# Patient Record
Sex: Female | Born: 1985 | Race: White | Hispanic: No | Marital: Married | Smoking: Former smoker
Health system: Southern US, Community
[De-identification: ages and names within clinical notes are randomized; demographics above are authoritative.]

## PROBLEM LIST (undated history)

## (undated) ENCOUNTER — Inpatient Hospital Stay (HOSPITAL_COMMUNITY): Payer: Self-pay

## (undated) DIAGNOSIS — N12 Tubulo-interstitial nephritis, not specified as acute or chronic: Secondary | ICD-10-CM

## (undated) DIAGNOSIS — R87629 Unspecified abnormal cytological findings in specimens from vagina: Secondary | ICD-10-CM

## (undated) DIAGNOSIS — O2302 Infections of kidney in pregnancy, second trimester: Secondary | ICD-10-CM

## (undated) HISTORY — PX: ADENOIDECTOMY: SUR15

---

## 2007-02-03 ENCOUNTER — Inpatient Hospital Stay (HOSPITAL_COMMUNITY): Admission: AD | Admit: 2007-02-03 | Discharge: 2007-02-03 | Payer: Self-pay | Admitting: Obstetrics and Gynecology

## 2007-02-07 ENCOUNTER — Inpatient Hospital Stay (HOSPITAL_COMMUNITY): Admission: AD | Admit: 2007-02-07 | Discharge: 2007-02-09 | Payer: Self-pay | Admitting: Obstetrics and Gynecology

## 2011-04-18 LAB — CBC
HCT: 30 — ABNORMAL LOW
HCT: 34 — ABNORMAL LOW
Hemoglobin: 10.1 — ABNORMAL LOW
MCHC: 33
MCHC: 33.3
MCHC: 33.7
MCV: 81.5
Platelets: 239
Platelets: 283
RBC: 3.94
RDW: 13.7
RDW: 14.1 — ABNORMAL HIGH
WBC: 14.9 — ABNORMAL HIGH

## 2015-07-05 NOTE — L&D Delivery Note (Signed)
Delivery Note  At 2052 a viable female was delivered SVD via  (Presentation:LOA ;  ) over intact perineum. APGAR: 9/9, ; weight  .  pending Placenta status: Spontaneous Intact , Pitocin infusing .  Cord:  3 vesselswith the following complications: triple nuchal cord     Anesthesia:  Epidural Episiotomy:   Lacerations:  first degree vaginal lac Suture Repair: 2.0 vicryl Est. Blood Loss (mL):  100  Mom to postpartum.  Baby to Couplet care / Skin to Skin.  Name : Jillian Mcfarland 03/31/2016, 9:12 PM

## 2015-09-14 LAB — OB RESULTS CONSOLE HIV ANTIBODY (ROUTINE TESTING): HIV: NONREACTIVE

## 2015-09-14 LAB — OB RESULTS CONSOLE HEPATITIS B SURFACE ANTIGEN: HEP B S AG: NEGATIVE

## 2015-09-14 LAB — OB RESULTS CONSOLE RUBELLA ANTIBODY, IGM: Rubella: NON-IMMUNE/NOT IMMUNE

## 2015-09-14 LAB — OB RESULTS CONSOLE RPR: RPR: NONREACTIVE

## 2015-09-14 LAB — OB RESULTS CONSOLE GC/CHLAMYDIA
CHLAMYDIA, DNA PROBE: NEGATIVE
GC PROBE AMP, GENITAL: NEGATIVE

## 2016-01-07 ENCOUNTER — Inpatient Hospital Stay (HOSPITAL_COMMUNITY): Payer: Medicaid Other

## 2016-01-07 ENCOUNTER — Inpatient Hospital Stay (HOSPITAL_COMMUNITY)
Admission: AD | Admit: 2016-01-07 | Discharge: 2016-01-09 | DRG: 781 | Disposition: A | Discharge status: Home / Self Care | Payer: Medicaid Other | Source: Ambulatory Visit | Attending: Obstetrics and Gynecology | Admitting: Obstetrics and Gynecology

## 2016-01-07 ENCOUNTER — Encounter (HOSPITAL_COMMUNITY): Payer: Self-pay | Admitting: *Deleted

## 2016-01-07 DIAGNOSIS — Z3A27 27 weeks gestation of pregnancy: Secondary | ICD-10-CM

## 2016-01-07 DIAGNOSIS — G43909 Migraine, unspecified, not intractable, without status migrainosus: Secondary | ICD-10-CM | POA: Diagnosis not present

## 2016-01-07 DIAGNOSIS — Z87891 Personal history of nicotine dependence: Secondary | ICD-10-CM

## 2016-01-07 DIAGNOSIS — O2302 Infections of kidney in pregnancy, second trimester: Principal | ICD-10-CM | POA: Diagnosis present

## 2016-01-07 DIAGNOSIS — G43809 Other migraine, not intractable, without status migrainosus: Secondary | ICD-10-CM | POA: Diagnosis present

## 2016-01-07 DIAGNOSIS — B9620 Unspecified Escherichia coli [E. coli] as the cause of diseases classified elsewhere: Secondary | ICD-10-CM | POA: Diagnosis present

## 2016-01-07 DIAGNOSIS — Z789 Other specified health status: Secondary | ICD-10-CM

## 2016-01-07 DIAGNOSIS — Z283 Underimmunization status: Secondary | ICD-10-CM

## 2016-01-07 DIAGNOSIS — O09899 Supervision of other high risk pregnancies, unspecified trimester: Secondary | ICD-10-CM

## 2016-01-07 DIAGNOSIS — O9989 Other specified diseases and conditions complicating pregnancy, childbirth and the puerperium: Secondary | ICD-10-CM

## 2016-01-07 DIAGNOSIS — N1330 Unspecified hydronephrosis: Secondary | ICD-10-CM | POA: Diagnosis present

## 2016-01-07 DIAGNOSIS — O2303 Infections of kidney in pregnancy, third trimester: Secondary | ICD-10-CM | POA: Diagnosis present

## 2016-01-07 HISTORY — DX: Unspecified abnormal cytological findings in specimens from vagina: R87.629

## 2016-01-07 HISTORY — DX: Infections of kidney in pregnancy, second trimester: O23.02

## 2016-01-07 LAB — TYPE AND SCREEN
ABO/RH(D): O POS
Antibody Screen: NEGATIVE

## 2016-01-07 LAB — COMPREHENSIVE METABOLIC PANEL
ALT: 15 U/L (ref 14–54)
ANION GAP: 10 (ref 5–15)
AST: 19 U/L (ref 15–41)
Albumin: 2.8 g/dL — ABNORMAL LOW (ref 3.5–5.0)
Alkaline Phosphatase: 140 U/L — ABNORMAL HIGH (ref 38–126)
BUN: 8 mg/dL (ref 6–20)
CALCIUM: 8.6 mg/dL — AB (ref 8.9–10.3)
CHLORIDE: 104 mmol/L (ref 101–111)
CO2: 18 mmol/L — AB (ref 22–32)
Creatinine, Ser: 0.56 mg/dL (ref 0.44–1.00)
GFR calc non Af Amer: >60 mL/min (ref >60)
Glucose, Bld: 71 mg/dL (ref 65–99)
POTASSIUM: 3.8 mmol/L (ref 3.5–5.1)
SODIUM: 132 mmol/L — AB (ref 135–145)
TOTAL PROTEIN: 6.5 g/dL (ref 6.5–8.1)
Total Bilirubin: 0.9 mg/dL (ref 0.3–1.2)

## 2016-01-07 LAB — URINE MICROSCOPIC-ADD ON

## 2016-01-07 LAB — URINALYSIS, ROUTINE W REFLEX MICROSCOPIC
BILIRUBIN URINE: NEGATIVE
Glucose, UA: NEGATIVE mg/dL
NITRITE: NEGATIVE
Protein, ur: NEGATIVE mg/dL
SPECIFIC GRAVITY, URINE: 1.01 (ref 1.005–1.030)
pH: 5.5 (ref 5.0–8.0)

## 2016-01-07 LAB — CBC WITH DIFFERENTIAL/PLATELET
Basophils Absolute: 0 10*3/uL (ref 0.0–0.1)
Basophils Relative: 0 %
EOS ABS: 0 10*3/uL (ref 0.0–0.7)
EOS PCT: 0 %
HCT: 31.4 % — ABNORMAL LOW (ref 36.0–46.0)
Hemoglobin: 10.6 g/dL — ABNORMAL LOW (ref 12.0–15.0)
LYMPHS ABS: 1.9 10*3/uL (ref 0.7–4.0)
Lymphocytes Relative: 15 %
MCH: 27.5 pg (ref 26.0–34.0)
MCHC: 33.8 g/dL (ref 30.0–36.0)
MCV: 81.6 fL (ref 78.0–100.0)
MONOS PCT: 6 %
Monocytes Absolute: 0.7 10*3/uL (ref 0.1–1.0)
Neutro Abs: 10 10*3/uL — ABNORMAL HIGH (ref 1.7–7.7)
Neutrophils Relative %: 79 %
PLATELETS: 299 10*3/uL (ref 150–400)
RBC: 3.85 MIL/uL — ABNORMAL LOW (ref 3.87–5.11)
RDW: 13.2 % (ref 11.5–15.5)
WBC: 12.7 10*3/uL — ABNORMAL HIGH (ref 4.0–10.5)

## 2016-01-07 MED ORDER — PRENATAL MULTIVITAMIN CH
1.0000 | ORAL_TABLET | Freq: Every day | ORAL | Status: DC
  Administered 2016-01-08: 1 via ORAL
  Filled 2016-01-07: qty 1

## 2016-01-07 MED ORDER — DEXTROSE 5 % IN LACTATED RINGERS IV BOLUS
1000.0000 mL | Freq: Once | INTRAVENOUS | Status: AC
  Administered 2016-01-07: 1000 mL via INTRAVENOUS

## 2016-01-07 MED ORDER — DOCUSATE SODIUM 100 MG PO CAPS
100.0000 mg | ORAL_CAPSULE | Freq: Every day | ORAL | Status: DC
Start: 2016-01-07 — End: 2016-01-09
  Administered 2016-01-08 – 2016-01-09 (×2): 100 mg via ORAL
  Filled 2016-01-07 (×2): qty 1

## 2016-01-07 MED ORDER — LACTATED RINGERS IV BOLUS (SEPSIS)
1000.0000 mL | Freq: Once | INTRAVENOUS | Status: AC
  Administered 2016-01-07: 1000 mL via INTRAVENOUS

## 2016-01-07 MED ORDER — CEFTRIAXONE SODIUM 2 G IJ SOLR
2.0000 g | INTRAMUSCULAR | Status: DC
  Administered 2016-01-07 – 2016-01-08 (×2): 2 g via INTRAVENOUS
  Filled 2016-01-07 (×3): qty 2

## 2016-01-07 MED ORDER — ZOLPIDEM TARTRATE 5 MG PO TABS: 5.0000 mg | ORAL_TABLET | Freq: Every evening | ORAL | Status: DC | PRN

## 2016-01-07 MED ORDER — SODIUM CHLORIDE 0.9 % IV SOLN
INTRAVENOUS | Status: DC
  Administered 2016-01-07 – 2016-01-08 (×3): via INTRAVENOUS
  Administered 2016-01-09: 150 mL/h via INTRAVENOUS
  Administered 2016-01-09: 07:00:00 via INTRAVENOUS

## 2016-01-07 MED ORDER — ACETAMINOPHEN 325 MG PO TABS
650.0000 mg | ORAL_TABLET | ORAL | Status: DC | PRN
  Administered 2016-01-08 (×3): 650 mg via ORAL
  Filled 2016-01-07 (×3): qty 2

## 2016-01-07 MED ORDER — CALCIUM CARBONATE ANTACID 500 MG PO CHEW
2.0000 | CHEWABLE_TABLET | ORAL | Status: DC | PRN
  Administered 2016-01-08: 400 mg via ORAL
  Filled 2016-01-07: qty 2

## 2016-01-07 NOTE — MAU Provider Note (Signed)
History     CSN: [id number]  Arrival date and time: 01/07/16 1708   First Provider Initiated Contact with Patient 01/07/16 1802      Chief Complaint  Patient presents with  . Dehydration   HPI   [name] is a 30 y.o. female G2P1001 @ 6w1dhere with multiple complaints " I haven't felt well all day". The symptoms started over the weekend " I felt like I had a UTI over the weekend because my bladder hurt". Her symptoms currently include HA, increased thirst, minimal energy, mild temp at home, right sided lower back pain and nausea and vomiting.   She has had no vomiting today, however feels nauseated. She has not been able to eat at all today. She rates her back pain 3/10; right side and constant pain. The pain is lower in her back, not high. The pain feels deep, "if I push on it I cannot feel the pain".  No one else in her house is sick.   OB History    Gravida Para Term Preterm AB TAB SAB Ectopic Multiple Living   '2 1 1       1      '$ Past Medical History  Diagnosis Date  . Vaginal Pap smear, abnormal     Past Surgical History  Procedure Laterality Date  . Adenoidectomy      History reviewed. No pertinent family history.  Social History  Substance Use Topics  . Smoking status: Former Smoker -- 1.00 packs/day for 10 years    Types: Cigarettes    Quit date: 01/06/2014  . Smokeless tobacco: None  . Alcohol Use: No    Allergies: Allergies not on file  No prescriptions prior to admission   Results for orders placed or performed during the hospital encounter of 01/07/16 (from the past 48 hour(s))  [facility]inal[facility], [facility] reflex microscopic [facility]     Status: Abnormal   Collection Time: 01/07/16  5:15 PM  Result Value Ref Range   Color, Urine YELLOW YELLOW   APPearance CLEAR CLEAR   Specific Gravity, Urine 1.010 1.005 - 1.030   pH 5.5 5.0 - 8.0   Glucose, UA NEGATIVE NEGATIVE mg/dL   Hgb urine dipstick TRACE (A) NEGATIVE   Bilirubin Urine NEGATIVE  NEGATIVE   Ketones, ur >80 (A) NEGATIVE mg/dL   Protein, ur NEGATIVE NEGATIVE mg/dL   Nitrite NEGATIVE NEGATIVE   Leukocytes, UA SMALL (A) NEGATIVE  Urine microscopic-add on     Status: Abnormal   Collection Time: 01/07/16  5:15 PM  Result Value Ref Range   Squamous Epithelial / LPF 0-5 (A) NONE SEEN   WBC, UA 6-30 0 - 5 WBC/hpf   RBC / HPF 0-5 0 - 5 RBC/hpf   Bacteria, UA FEW (A) NONE SEEN  CBC with Differential     Status: Abnormal   Collection Time: 01/07/16  6:36 PM  Result Value Ref Range   WBC 12.7 (H) 4.0 - 10.5 K/uL   RBC 3.85 (L) 3.87 - 5.11 MIL/uL   Hemoglobin 10.6 (L) 12.0 - 15.0 g/dL   HCT 31.4 (L) 36.0 - 46.0 %   MCV 81.6 78.0 - 100.0 fL   MCH 27.5 26.0 - 34.0 pg   MCHC 33.8 30.0 - 36.0 g/dL   RDW 13.2 11.5 - 15.5 %   Platelets 299 150 - 400 K/uL   Neutrophils Relative % 79 %   Neutro Abs 10.0 (H) 1.7 - 7.7 K/uL   Lymphocytes Relative 15 %  Lymphs Abs 1.9 0.7 - 4.0 K/uL   Monocytes Relative 6 %   Monocytes Absolute 0.7 0.1 - 1.0 K/uL   Eosinophils Relative 0 %   Eosinophils Absolute 0.0 0.0 - 0.7 K/uL   Basophils Relative 0 %   Basophils Absolute 0.0 0.0 - 0.1 K/uL  Comprehensive metabolic panel     Status: Abnormal   Collection Time: 01/07/16  6:36 PM  Result Value Ref Range   Sodium 132 (L) 135 - 145 mmol/L   Potassium 3.8 3.5 - 5.1 mmol/L   Chloride 104 101 - 111 mmol/L   CO2 18 (L) 22 - 32 mmol/L   Glucose, Bld 71 65 - 99 mg/dL   BUN 8 6 - 20 mg/dL   Creatinine, Ser 0.56 0.44 - 1.00 mg/dL   Calcium 8.6 (L) 8.9 - 10.3 mg/dL   Total Protein 6.5 6.5 - 8.1 g/dL   Albumin 2.8 (L) 3.5 - 5.0 g/dL   AST 19 15 - 41 U/L   ALT 15 14 - 54 U/L   Alkaline Phosphatase 140 (H) 38 - 126 U/L   Total Bilirubin 0.9 0.3 - 1.2 mg/dL   GFR calc non Af Amer >60 >60 mL/min   GFR calc Af Amer >60 >60 mL/min    Comment: (NOTE) The eGFR has been calculated using the CKD EPI equation. This calculation has not been validated in all clinical situations. eGFR's persistently  <60 mL/min signify possible Chronic Kidney Disease.    Anion gap 10 5 - 15    Review of Systems  Constitutional: Positive for fever and chills.  Gastrointestinal: Negative for nausea, vomiting and abdominal pain.  Genitourinary: Positive for dysuria, urgency and frequency. Negative for hematuria and flank pain.  Musculoskeletal: Positive for back pain.   Physical Exam   Blood pressure 130/72, pulse 113, temperature 100 F (37.8 C), temperature source Oral, resp. rate 18, height '5\' 6"'$  (1.676 m), weight 209 lb 6.4 oz (94.983 kg), SpO2 100 %.  Physical Exam  Constitutional: She is oriented to person, place, and time. She appears well-developed and well-nourished.  Non-toxic appearance. She has a sickly appearance. No distress.  GI: There is no CVA tenderness.  Musculoskeletal: Normal range of motion.  Neurological: She is alert and oriented to person, place, and time.  Skin: Skin is warm. She is not diaphoretic.  Psychiatric: Her behavior is normal.   Fetal Tracing: Baseline: 150 bpm  Variability: Moderate  Accelerations: 15x15 Decelerations: none Toco: none  MAU Course  Procedures  None  MDM  Patient declines the need for pain medication at this time. No vomiting noted in MAU.  LR bolus X 1 D5 LR bolus X 1 CBC CMP  Renal US Discussed patient with Dr. Cletis Media, discussed HPI, labs and plan of care.  Report given to V. Cira Servant CNM who resumes care of the patient. Patient awaiting renal US.   Lezlie Lye, NP 01/07/2016 7:50 PM   Assessment and Plan

## 2016-01-07 NOTE — MAU Note (Signed)
Pt reports she had been having some pain with urination and her urine was dark since Monday. Also c/o of some back and flank pain. Felt like she was running a fever last night and today. Went to MD office and they told her she was dehyrtated and sent her to MAU.

## 2016-01-07 NOTE — H&P (Signed)
Jillian Mcfarland is a 30 y.o. female, G2P1001 at 3127 1/7 weeks, presenting for admission for presumptive pyelonephritis.  Seen in office today with c/o fever, N/V, malaise since 2 days ago.  Reported low-grade fever of 99, with vomiting last night.  Sent to MAU for IV hydration and further evaluation.  T max 100 at 1719.  Denies leaking, bleeding, reports +FM.    Feeling better after IV hydration, but still has low-grade fever and right CVAT.  Renal US showed right hydronephrosis, UA with small LE and trace blood.  Patient Active Problem List   Diagnosis Date Noted  . Pyelonephritis affecting pregnancy 01/07/2016  . Migraine syndrome 01/07/2016  . Rubella non-immune status, antepartum 01/07/2016    History of present pregnancy: Patient entered care at 10 5/7 weeks.   EDC of 03/30/16 was established by LMP, and congruent with US in 1st trimester.   Anatomy scan: 20 6/7 weeks, with limited anatomy and an anterior placenta.   Additional US evaluations:   23 6/7 weeks, for f/u anatomy:  EFW 1+8, 68%ile, normal fluid, normal cervical length Significant prenatal events:  On ATB (Macrobid) early pregnancy for positive urine culture with 100K Ecoli.  Repeat urine culture 12/15/15 showed 30K Ecoli, but no sx.  Was not treated at that time due to no sx. Last evaluation:  01/07/16 at office for problem visit of UTI sx, malaise, abdominal pain, fever.  BP 118/62, weight 209.  OB History    Gravida Para Term Preterm AB TAB SAB Ectopic Multiple Living   2 1 1       1     2008--[facility], 41 weeks, 8 hour labor, 7+3[facility]  Past Medical History  Diagnosis Date  . Vaginal Pap smear, abnormal    Past Surgical History  Procedure Laterality Date  . Adenoidectomy     Family History: MGM asthma, cancer,arthritis; MGM cancer, arthritis; Brother ankylosing spondylitis; PGM and PGF arthritis Social History:  reports that she quit smoking about 2 years ago. Her smoking use included Cigarettes. She has a 10  pack-year smoking history. She does not have any smokeless tobacco history on file. She reports that she does not drink alcohol or use illicit drugs.  Patient is Caucasian, of the [place], has some college, employed as Chartered loss adjusterchildcare provider. Married to TX[employer] Corpafael Tomson, who is present and supportive.   Prenatal Transfer Tool  Maternal Diabetes:  Not tested yet Genetic Screening: Normal first trimester screen and AFP Maternal Ultrasounds/Referrals: Normal Fetal Ultrasounds or other Referrals:  None Maternal Substance Abuse:  No Significant Maternal Medications:  None Significant Maternal Lab Results: None  TDAP NA Flu NA  ROS:  Right flank pain, fever, nausea, malaise  No Known Allergies    Blood pressure 130/72, pulse 110, temperature 98.7 F (37.1 C), temperature source Oral, resp. rate 18, height 5\' 6"  (1.676 m), weight 94.983 kg (209 lb 6.4 oz), SpO2 96 %.   Filed Vitals:   01/07/16 2101 01/07/16 2109 01/07/16 2114 01/07/16 2119  BP:      Pulse: 111 119 103 110  Temp:    98.7 F (37.1 C)  TempSrc:    Oral  Resp:      Height:      Weight:      SpO2: 100% 100% 99% 96%  Temp max 100 at 1719, most recent 99.3 at 9:45p  Physical Exam: Does not appear toxic, but face is flushed Chest clear Heart RRR without murmur, mild tachycardia Abd gravid, NT, FH 27  cm + CVAT on right side Pelvic: deferred Ext: WNL, no edema  FHR: Category 1 UCs:  None  Results for orders placed or performed during the hospital encounter of 01/07/16 (from the past 24 hour(s))  Urinalysis, Routine w reflex microscopic (not at [facility])     Status: Abnormal   Collection Time: 01/07/16  5:15 PM  Result Value Ref Range   Color, Urine YELLOW YELLOW   APPearance CLEAR CLEAR   Specific Gravity, Urine 1.010 1.005 - 1.030   pH 5.5 5.0 - 8.0   Glucose, UA NEGATIVE NEGATIVE mg/dL   Hgb urine dipstick TRACE (A) NEGATIVE   Bilirubin Urine NEGATIVE NEGATIVE   Ketones, ur >80 (A) NEGATIVE mg/dL   Protein,  ur NEGATIVE NEGATIVE mg/dL   Nitrite NEGATIVE NEGATIVE   Leukocytes, UA SMALL (A) NEGATIVE  Urine microscopic-add on     Status: Abnormal   Collection Time: 01/07/16  5:15 PM  Result Value Ref Range   Squamous Epithelial / LPF 0-5 (A) NONE SEEN   WBC, UA 6-30 0 - 5 WBC/hpf   RBC / HPF 0-5 0 - 5 RBC/hpf   Bacteria, UA FEW (A) NONE SEEN  CBC with Differential     Status: Abnormal   Collection Time: 01/07/16  6:36 PM  Result Value Ref Range   WBC 12.7 (H) 4.0 - 10.5 K/uL   RBC 3.85 (L) 3.87 - 5.11 MIL/uL   Hemoglobin 10.6 (L) 12.0 - 15.0 g/dL   HCT 16.1 (L) 09.6 - 04.5 %   MCV 81.6 78.0 - 100.0 fL   MCH 27.5 26.0 - 34.0 pg   MCHC 33.8 30.0 - 36.0 g/dL   RDW 40.9 81.1 - 91.4 %   Platelets 299 150 - 400 K/uL   Neutrophils Relative % 79 %   Neutro Abs 10.0 (H) 1.7 - 7.7 K/uL   Lymphocytes Relative 15 %   Lymphs Abs 1.9 0.7 - 4.0 K/uL   Monocytes Relative 6 %   Monocytes Absolute 0.7 0.1 - 1.0 K/uL   Eosinophils Relative 0 %   Eosinophils Absolute 0.0 0.0 - 0.7 K/uL   Basophils Relative 0 %   Basophils Absolute 0.0 0.0 - 0.1 K/uL  Comprehensive metabolic panel     Status: Abnormal   Collection Time: 01/07/16  6:36 PM  Result Value Ref Range   Sodium 132 (L) 135 - 145 mmol/L   Potassium 3.8 3.5 - 5.1 mmol/L   Chloride 104 101 - 111 mmol/L   CO2 18 (L) 22 - 32 mmol/L   Glucose, Bld 71 65 - 99 mg/dL   BUN 8 6 - 20 mg/dL   Creatinine, Ser 7.82 0.44 - 1.00 mg/dL   Calcium 8.6 (L) 8.9 - 10.3 mg/dL   Total Protein 6.5 6.5 - 8.1 g/dL   Albumin 2.8 (L) 3.5 - 5.0 g/dL   AST 19 15 - 41 U/L   ALT 15 14 - 54 U/L   Alkaline Phosphatase 140 (H) 38 - 126 U/L   Total Bilirubin 0.9 0.3 - 1.2 mg/dL   GFR calc non Af Amer >60 >60 mL/min   GFR calc Af Amer >60 >60 mL/min   Anion gap 10 5 - 15   [place]:   IMPRESSION: Moderate RIGHT hydronephrosis with renal pelvis 2.3 cm diameter.  Diminished RIGHT ureteral jet versus LEFT.  Distal RIGHT ureteral obstruction not excluded.  Prenatal  labs: ABO, Rh:  O+ Antibody:  Neg Rubella:  Non-immune RPR:   NR HBsAg:   Neg  HIV:   NR GBS:  NA Sickle cell/Hgb electrophoresis:  NA Pap:  WNL 2016 GC:  Negative 09/2015 Chlamydia:  Negative 09/2015 Genetic screenings:  Normal 1st trimester screen and AFP Glucola:  NA Other:   Hgb 12.9 at NOB   Assessment/Plan: IUP at 27 1/7 weeks Presumptive pyelonephritis  Right hydronephrosis  Plan: Admit to Antenatal per consult with Dr. Dion BodyVarnado Rocephin 2 gm IV q 24 hours IV hydration Zofran prn nausea Percocet prn pain Repeat CBC/diff in am Tylenol prn fever >/=100.5 Await urine culture  Liseth Wann, CNM, [place] 01/07/2016, 10:20 PM

## 2016-01-08 DIAGNOSIS — E860 Dehydration: Secondary | ICD-10-CM | POA: Diagnosis present

## 2016-01-08 DIAGNOSIS — Z87891 Personal history of nicotine dependence: Secondary | ICD-10-CM | POA: Diagnosis not present

## 2016-01-08 DIAGNOSIS — Z3A27 27 weeks gestation of pregnancy: Secondary | ICD-10-CM | POA: Diagnosis not present

## 2016-01-08 DIAGNOSIS — G43809 Other migraine, not intractable, without status migrainosus: Secondary | ICD-10-CM | POA: Diagnosis present

## 2016-01-08 DIAGNOSIS — N1330 Unspecified hydronephrosis: Secondary | ICD-10-CM | POA: Diagnosis present

## 2016-01-08 DIAGNOSIS — O2302 Infections of kidney in pregnancy, second trimester: Secondary | ICD-10-CM | POA: Diagnosis present

## 2016-01-08 DIAGNOSIS — Z789 Other specified health status: Secondary | ICD-10-CM | POA: Diagnosis not present

## 2016-01-08 DIAGNOSIS — B9620 Unspecified Escherichia coli [E. coli] as the cause of diseases classified elsewhere: Secondary | ICD-10-CM | POA: Diagnosis present

## 2016-01-08 LAB — CBC WITH DIFFERENTIAL/PLATELET
BASOS ABS: 0 10*3/uL (ref 0.0–0.1)
Basophils Relative: 0 %
EOS ABS: 0 10*3/uL (ref 0.0–0.7)
EOS PCT: 0 %
HCT: 29.7 % — ABNORMAL LOW (ref 36.0–46.0)
Hemoglobin: 9.9 g/dL — ABNORMAL LOW (ref 12.0–15.0)
LYMPHS PCT: 16 %
Lymphs Abs: 1.8 10*3/uL (ref 0.7–4.0)
MCH: 27 pg (ref 26.0–34.0)
MCHC: 33.3 g/dL (ref 30.0–36.0)
MCV: 81.1 fL (ref 78.0–100.0)
Monocytes Absolute: 0.7 10*3/uL (ref 0.1–1.0)
Monocytes Relative: 7 %
Neutro Abs: 8.6 10*3/uL — ABNORMAL HIGH (ref 1.7–7.7)
Neutrophils Relative %: 77 %
PLATELETS: 283 10*3/uL (ref 150–400)
RBC: 3.66 MIL/uL — AB (ref 3.87–5.11)
RDW: 13.3 % (ref 11.5–15.5)
WBC: 11.1 10*3/uL — AB (ref 4.0–10.5)

## 2016-01-08 LAB — ABO/RH: ABO/RH(D): O POS

## 2016-01-08 NOTE — Progress Notes (Signed)
  [place], [facility] doctorAmber  Subjective:  Patient reports feeling better, but still continues with right side mid back pain, 3/10 intensity, constant, but worsens when she urinates.  She denies contractions, vaginal bleeding or leakage of fluid.  She reports normal fetal movement.  Objective: I have reviewed patient's vital signs, medications and labs.  Filed Vitals:   01/08/16 0514 01/08/16 1029  BP: 114/61 108/57  Pulse: 98 97  Temp: 98.2 F (36.8 C) 98.2 F (36.8 C)  Resp: 20 18   Tmax 100.0 on 01/08/16@ 1719.      General: alert, cooperative and no distress Resp: clear to auscultation bilaterally Cardio: regular rate and rhythm, S1, S2 normal, no murmur, click, rub or gallop GI: soft, non-tender; bowel sounds normal; no masses,  no organomegaly Extremities: extremities normal, atraumatic, no cyanosis or edema  Back: no CVA tenderness bilaterally.  No skin changes bilaterally.    CBC    Component Value Date/Time   WBC 11.1* 01/08/2016 0553   RBC 3.66* 01/08/2016 0553   HGB 9.9* 01/08/2016 0553   HCT 29.7* 01/08/2016 0553   PLT 283 01/08/2016 0553   MCV 81.1 01/08/2016 0553   MCH 27.0 01/08/2016 0553   MCHC 33.3 01/08/2016 0553   RDW 13.3 01/08/2016 0553   LYMPHSABS 1.8 01/08/2016 0553   MONOABS 0.7 01/08/2016 0553   EOSABS 0.0 01/08/2016 0553   BASOSABS 0.0 01/08/2016 0553   . cefTRIAXone (ROCEPHIN)  IV  2 g Intravenous Q24H  . docusate sodium  100 mg Oral Daily  . prenatal multivitamin  1 tablet Oral Q1200     Assessment/Plan: 30 y/o G2P1001 @ 2927 W 3 days EGA admitted with rnausea, vomiting, malaise, right sided back pain thought to be secondary to pyelonephritis, stable  -Urine culture pending, f/u. -Antibiotics- Rocephin -Out of bed and ambulation  -Pain meds prn -Plan for discharge tomorrow if clinically improved.      [facility] Center[facility]UL[facility],Sitlaly Gudiel WAKURU 01/08/2016, 1:11 PM

## 2016-01-09 LAB — CULTURE, OB URINE
Culture: >=100000 — AB
Special Requests: NORMAL

## 2016-01-09 MED ORDER — SULFAMETHOXAZOLE-TRIMETHOPRIM 800-160 MG PO TABS
1.0000 | ORAL_TABLET | Freq: Two times a day (BID) | ORAL | Status: AC
Start: 2016-01-09 — End: 2016-01-23

## 2016-01-09 NOTE — Discharge Summary (Signed)
OB Discharge Summary     Patient Name: Jillian Mcfarland DOB: 1986/06/15 MRN: [id number]  Date of admission: 01/07/2016 Delivering MD: This patient has no babies on file.  Date of discharge: 01/09/2016  Admitting diagnosis: 27WKS DEHYDRATION Intrauterine pregnancy: [redacted]w[redacted]d     Secondary diagnosis:  Active Problems:   Pyelonephritis affecting pregnancy   Migraine syndrome   Rubella non-immune status, antepartum  Additional problems: None     Discharge diagnosis: Pyelonephritis in pregnancy                                                                                         Hospital course:  Patient presented with nausea vomiting malaise and right-sided back pain. She received IV antibiotics for presumed pyelonephritis. Urine culture came back with greater than 100,000 Escherichia coli. She improved clinically, remained afebrile and was deemed stable for discharge after 2 days.  Physical exam  Filed Vitals:   01/08/16 1851 01/08/16 2110 01/09/16 0535 01/09/16 0954  BP: 110/57 111/66 121/72 118/67  Pulse: 93 95 96 99  Temp: 98.4 F (36.9 C) 98.5 F (36.9 C) 98.5 F (36.9 C) 98.4 F (36.9 C)  TempSrc: Oral Oral Oral Oral  Resp: Height:      Weight:      SpO2: 100% 99% 99% 100%   General: alert, cooperative and no distress Back: No CVA tenderness Abdomen: Soft nontender gravid DVT Evaluation: No evidence of DVT seen on physical exam. No significant calf/ankle edema. Labs: Lab Results  Component Value Date   WBC 11.1* 01/08/2016   HGB 9.9* 01/08/2016   HCT 29.7* 01/08/2016   MCV 81.1 01/08/2016   PLT 283 01/08/2016   CMP Latest Ref Rng 01/07/2016  Glucose 65 - 99 mg/dL 71  BUN 6 - 20 mg/dL 8  Creatinine 4.09 - 8.11 mg/dL 9.14  Sodium 782 - 956 mmol/L 132(L)  Potassium 3.5 - 5.1 mmol/L 3.8  Chloride 101 - 111 mmol/L 104  CO2 22 - 32 mmol/L 18(L)  Calcium 8.9 - 10.3 mg/dL 2.1(H)  Total Protein 6.5 - 8.1 g/dL 6.5  Total Bilirubin 0.3 - 1.2 mg/dL 0.9   Alkaline Phos 38 - 126 U/L 140(H)  AST 15 - 41 U/L 19  ALT 14 - 54 U/L 15    Discharge instruction: per "Pyelonephritis"  After visit meds:    Medication List    TAKE these medications        acetaminophen 500 MG tablet  Commonly known as:  TYLENOL  Take 1,000 mg by mouth every 6 (six) hours as needed for mild pain.     calcium carbonate 500 MG chewable tablet  Commonly known as:  TUMS - dosed in mg elemental calcium  Chew 2 tablets by mouth as needed for indigestion or heartburn.     [employer] PRENATAL GUMMY PO  Take 2 tablets by mouth daily.     sulfamethoxazole-trimethoprim 800-160 MG tablet  Commonly known as:  BACTRIM DS,SEPTRA DS  Take 1 tablet by mouth 2 (two) times daily.        Diet: routine diet  Activity: Advance as tolerated. Pelvic rest for  6 weeks.   Outpatient follow up:3 days as scheduled before for routine OB visit.   Disposition: Home.   01/09/2016 Konrad FelixKULWA,Almond Fitzg[place]bon WAKURU, MD

## 2016-01-09 NOTE — Progress Notes (Signed)
D/c instructions and prescriptions reviewed with patient. Pt verbalized understanding. Has a f/u appt with MD on 7/11. Pt stable, ambulatory, d/c home with family.

## 2016-01-25 ENCOUNTER — Encounter (HOSPITAL_COMMUNITY): Payer: Self-pay | Admitting: *Deleted

## 2016-01-25 ENCOUNTER — Inpatient Hospital Stay (HOSPITAL_COMMUNITY): Payer: Medicaid Other

## 2016-01-25 ENCOUNTER — Inpatient Hospital Stay (HOSPITAL_COMMUNITY)
Admission: AD | Admit: 2016-01-25 | Discharge: 2016-01-27 | Disposition: A | Discharge status: Home / Self Care | Payer: Medicaid Other | Source: Ambulatory Visit | Attending: Obstetrics & Gynecology | Admitting: Obstetrics & Gynecology

## 2016-01-25 DIAGNOSIS — Z87891 Personal history of nicotine dependence: Secondary | ICD-10-CM | POA: Diagnosis not present

## 2016-01-25 DIAGNOSIS — O9989 Other specified diseases and conditions complicating pregnancy, childbirth and the puerperium: Secondary | ICD-10-CM | POA: Diagnosis not present

## 2016-01-25 DIAGNOSIS — Z87440 Personal history of urinary (tract) infections: Secondary | ICD-10-CM | POA: Insufficient documentation

## 2016-01-25 DIAGNOSIS — O2303 Infections of kidney in pregnancy, third trimester: Secondary | ICD-10-CM | POA: Insufficient documentation

## 2016-01-25 DIAGNOSIS — Z3A29 29 weeks gestation of pregnancy: Secondary | ICD-10-CM | POA: Insufficient documentation

## 2016-01-25 DIAGNOSIS — O2300 Infections of kidney in pregnancy, unspecified trimester: Secondary | ICD-10-CM

## 2016-01-25 DIAGNOSIS — R109 Unspecified abdominal pain: Secondary | ICD-10-CM | POA: Diagnosis not present

## 2016-01-25 DIAGNOSIS — Z8759 Personal history of other complications of pregnancy, childbirth and the puerperium: Secondary | ICD-10-CM

## 2016-01-25 DIAGNOSIS — G43909 Migraine, unspecified, not intractable, without status migrainosus: Secondary | ICD-10-CM | POA: Diagnosis not present

## 2016-01-25 HISTORY — DX: Tubulo-interstitial nephritis, not specified as acute or chronic: N12

## 2016-01-25 LAB — COMPREHENSIVE METABOLIC PANEL
ALT: 18 U/L (ref 14–54)
AST: 20 U/L (ref 15–41)
Albumin: 3.1 g/dL — ABNORMAL LOW (ref 3.5–5.0)
Alkaline Phosphatase: 109 U/L (ref 38–126)
Anion gap: 9 (ref 5–15)
BUN: 8 mg/dL (ref 6–20)
CHLORIDE: 106 mmol/L (ref 101–111)
CO2: 20 mmol/L — AB (ref 22–32)
Calcium: 8.8 mg/dL — ABNORMAL LOW (ref 8.9–10.3)
Creatinine, Ser: 0.53 mg/dL (ref 0.44–1.00)
Glucose, Bld: 84 mg/dL (ref 65–99)
POTASSIUM: 4 mmol/L (ref 3.5–5.1)
SODIUM: 135 mmol/L (ref 135–145)
Total Bilirubin: 0.2 mg/dL — ABNORMAL LOW (ref 0.3–1.2)
Total Protein: 7.1 g/dL (ref 6.5–8.1)

## 2016-01-25 LAB — CBC
HCT: 32.6 % — ABNORMAL LOW (ref 36.0–46.0)
Hemoglobin: 10.7 g/dL — ABNORMAL LOW (ref 12.0–15.0)
MCH: 26.7 pg (ref 26.0–34.0)
MCHC: 32.8 g/dL (ref 30.0–36.0)
MCV: 81.3 fL (ref 78.0–100.0)
PLATELETS: 336 10*3/uL (ref 150–400)
RBC: 4.01 MIL/uL (ref 3.87–5.11)
RDW: 13.4 % (ref 11.5–15.5)
WBC: 14.5 10*3/uL — AB (ref 4.0–10.5)

## 2016-01-25 LAB — URINALYSIS, ROUTINE W REFLEX MICROSCOPIC
BILIRUBIN URINE: NEGATIVE
Glucose, UA: NEGATIVE mg/dL
Ketones, ur: 15 mg/dL — AB
NITRITE: NEGATIVE
PH: 6.5 (ref 5.0–8.0)
Protein, ur: 30 mg/dL — AB
SPECIFIC GRAVITY, URINE: 1.01 (ref 1.005–1.030)

## 2016-01-25 LAB — URINE MICROSCOPIC-ADD ON

## 2016-01-25 LAB — TYPE AND SCREEN
ABO/RH(D): O POS
Antibody Screen: NEGATIVE

## 2016-01-25 MED ORDER — DOCUSATE SODIUM 100 MG PO CAPS
100.0000 mg | ORAL_CAPSULE | Freq: Every day | ORAL | Status: DC
  Administered 2016-01-26: 100 mg via ORAL
  Filled 2016-01-25: qty 1

## 2016-01-25 MED ORDER — SODIUM CHLORIDE 0.9 % IV SOLN
INTRAVENOUS | Status: DC
  Administered 2016-01-25: 125 mL/h via INTRAVENOUS

## 2016-01-25 MED ORDER — CALCIUM CARBONATE ANTACID 500 MG PO CHEW: 2.0000 | CHEWABLE_TABLET | ORAL | Status: DC | PRN

## 2016-01-25 MED ORDER — ACETAMINOPHEN 325 MG PO TABS
650.0000 mg | ORAL_TABLET | ORAL | Status: DC | PRN
  Administered 2016-01-26: 650 mg via ORAL
  Filled 2016-01-25: qty 2

## 2016-01-25 MED ORDER — PRENATAL MULTIVITAMIN CH
1.0000 | ORAL_TABLET | Freq: Every day | ORAL | Status: DC
  Administered 2016-01-26: 1 via ORAL
  Filled 2016-01-25: qty 1

## 2016-01-25 MED ORDER — DEXTROSE 5 % IV SOLN
2.0000 g | Freq: Once | INTRAVENOUS | Status: AC
  Administered 2016-01-25: 2 g via INTRAVENOUS
  Filled 2016-01-25: qty 2

## 2016-01-25 MED ORDER — CALCIUM CARBONATE ANTACID 500 MG PO CHEW
2.0000 | CHEWABLE_TABLET | ORAL | Status: DC | PRN
Start: 2016-01-25 — End: 2016-01-25

## 2016-01-25 MED ORDER — LACTATED RINGERS IV SOLN
INTRAVENOUS | Status: DC
  Administered 2016-01-25 – 2016-01-27 (×4): via INTRAVENOUS

## 2016-01-25 MED ORDER — OXYCODONE-ACETAMINOPHEN 5-325 MG PO TABS
1.0000 | ORAL_TABLET | Freq: Four times a day (QID) | ORAL | Status: DC | PRN
  Administered 2016-01-25: 1 via ORAL
  Filled 2016-01-25: qty 1

## 2016-01-25 MED ORDER — PRENATAL MULTIVITAMIN CH: 1.0000 | ORAL_TABLET | Freq: Every day | ORAL | Status: DC

## 2016-01-25 MED ORDER — DEXTROSE 5 % IV SOLN: 1.0000 g | Freq: Once | INTRAVENOUS | Status: DC

## 2016-01-25 MED ORDER — ZOLPIDEM TARTRATE 5 MG PO TABS: 5.0000 mg | ORAL_TABLET | Freq: Every evening | ORAL | Status: DC | PRN

## 2016-01-25 MED ORDER — ACETAMINOPHEN 500 MG PO TABS: 1000.0000 mg | ORAL_TABLET | Freq: Four times a day (QID) | ORAL | Status: DC | PRN

## 2016-01-25 NOTE — MAU Provider Note (Signed)
History     CSN: [id number]  Arrival date and time: 01/25/16 1729   First Provider Initiated Contact with Patient 01/25/16 1854       Chief Complaint  Patient presents with  . Flank Pain  . Dysuria   HPI   Jillian Mcfarland is a 30 y.o. G2P1001 at [redacted]w[redacted]d who presents for flank pain. Pt was admitted for pyelonephritis earlier this month. Discharged on oral abx, which she finished yesterday. Reports resolution of symptoms until this morning. Since her first void this morning reports right flank/low back pain that is worse then when she was previously admitted. Rates pain 5/10 & describes as throbbing. Has taken tylenol without relief. Some nausea, no vomiting & currently not nauseated. Denies dysuria, frequency, hematuria, or fever/chills.  Denies abdominal pain/ctx, vaginal bleeding, or LOF.  Positive fetal movement.   OB History    Gravida Para Term Preterm AB Living   2 1 1     1    SAB TAB Ectopic Multiple Live Births         1        Past Medical History:  Diagnosis Date  . Pyelonephritis   . Pyelonephritis affecting pregnancy in second trimester, antepartum   . Vaginal Pap smear, abnormal     Past Surgical History:  Procedure Laterality Date  . ADENOIDECTOMY      Family History  Problem Relation Age of Onset  . Cancer Maternal Grandfather   . Cancer Paternal Grandmother     lung  . Cancer Paternal Grandfather     lung and skin    Social History  Substance Use Topics  . Smoking status: Former Smoker    Packs/day: 1.00    Years: 10.00    Types: Cigarettes    Quit date: 01/06/2014  . Smokeless tobacco: Never Used  . Alcohol use No    Allergies: No Known Allergies  Prescriptions Prior to Admission  Medication Sig Dispense Refill Last Dose  . acetaminophen (TYLENOL) 500 MG tablet Take 1,000 mg by mouth every 6 (six) hours as needed for mild pain.   01/25/2016 at Unknown time  . calcium carbonate (TUMS - DOSED IN MG ELEMENTAL CALCIUM) 500 MG chewable tablet Chew  2 tablets by mouth as needed for indigestion or heartburn.   Past Month at Unknown time  . nitrofurantoin, macrocrystal-monohydrate, (MACROBID) 100 MG capsule Take 100 mg by mouth 2 (two) times daily.   01/24/2016 at Unknown time  . Prenatal Vit-Fe Fumarate-FA (PRENATAL MULTIVITAMIN) TABS tablet Take 1 tablet by mouth daily at 12 noon.   01/25/2016 at Unknown time    Review of Systems  Constitutional: Negative for chills and fever.  Gastrointestinal: Positive for nausea. Negative for abdominal pain, constipation, diarrhea and vomiting.  Genitourinary: Positive for flank pain. Negative for dysuria, frequency and hematuria.  Musculoskeletal: Positive for back pain.  Neurological: Negative for weakness.   Physical Exam   Blood pressure 111/62, pulse 72, temperature 98.2 F (36.8 C), temperature source Oral, resp. rate 18.  Physical Exam  Nursing note and vitals reviewed. Constitutional: She is oriented to person, place, and time. She appears well-developed and well-nourished. No distress.  HENT:  Head: Normocephalic and atraumatic.  Eyes: Conjunctivae are normal. Right eye exhibits no discharge. Left eye exhibits no discharge. No scleral icterus.  Neck: Normal range of motion.  Cardiovascular: Normal rate.   Respiratory: Effort normal. No respiratory distress.  GI: Soft. There is no CVA tenderness.  Neurological: She is alert and oriented to  person, place, and time.  Skin: Skin is warm and dry. She is not diaphoretic.  Psychiatric: She has a normal mood and affect. Her behavior is normal. Judgment and thought content normal.   Fetal Tracing:  Baseline: 135 Variability: moderate Accelerations: 15x15 Decelerations: none  Toco: none MAU Course  Procedures Results for orders placed or performed during the hospital encounter of 01/25/16 (from the past 24 hour(s))  Urinalysis, Routine w reflex microscopic (not at [facility])     Status: Abnormal   Collection Time: 01/25/16  6:27 PM  Result  Value Ref Range   Color, Urine YELLOW YELLOW   APPearance CLOUDY (A) CLEAR   Specific Gravity, Urine 1.010 1.005 - 1.030   pH 6.5 5.0 - 8.0   Glucose, UA NEGATIVE NEGATIVE mg/dL   Hgb urine dipstick SMALL (A) NEGATIVE   Bilirubin Urine NEGATIVE NEGATIVE   Ketones, ur 15 (A) NEGATIVE mg/dL   Protein, ur 30 (A) NEGATIVE mg/dL   Nitrite NEGATIVE NEGATIVE   Leukocytes, UA LARGE (A) NEGATIVE  Urine microscopic-add on     Status: Abnormal   Collection Time: 01/25/16  6:27 PM  Result Value Ref Range   Squamous Epithelial / LPF 0-5 (A) NONE SEEN   WBC, UA TOO NUMEROUS TO COUNT 0 - 5 WBC/hpf   RBC / HPF 0-5 0 - 5 RBC/hpf   Bacteria, UA FEW (A) NONE SEEN  CBC     Status: Abnormal   Collection Time: 01/25/16  7:05 PM  Result Value Ref Range   WBC 14.5 (H) 4.0 - 10.5 K/uL   RBC 4.01 3.87 - 5.11 MIL/uL   Hemoglobin 10.7 (L) 12.0 - 15.0 g/dL   HCT 24.4 (L) 97.5 - 30.0 %   MCV 81.3 78.0 - 100.0 fL   MCH 26.7 26.0 - 34.0 pg   MCHC 32.8 30.0 - 36.0 g/dL   RDW 51.1 02.1 - 11.7 %   Platelets 336 150 - 400 K/uL  Comprehensive metabolic panel     Status: Abnormal   Collection Time: 01/25/16  7:05 PM  Result Value Ref Range   Sodium 135 135 - 145 mmol/L   Potassium 4.0 3.5 - 5.1 mmol/L   Chloride 106 101 - 111 mmol/L   CO2 20 (L) 22 - 32 mmol/L   Glucose, Bld 84 65 - 99 mg/dL   BUN 8 6 - 20 mg/dL   Creatinine, Ser 3.56 0.44 - 1.00 mg/dL   Calcium 8.8 (L) 8.9 - 10.3 mg/dL   Total Protein 7.1 6.5 - 8.1 g/dL   Albumin 3.1 (L) 3.5 - 5.0 g/dL   AST 20 15 - 41 U/L   ALT 18 14 - 54 U/L   Alkaline Phosphatase 109 38 - 126 U/L   Total Bilirubin 0.2 (L) 0.3 - 1.2 mg/dL   GFR calc non Af Amer >60 >60 mL/min   GFR calc Af Amer >60 >60 mL/min   Anion gap 9 5 - 15   US Renal  Result Date: 01/25/2016 CLINICAL DATA:  Patient is [redacted] weeks pregnant with history of UTI and pyelonephritis. EXAM: RENAL / URINARY TRACT ULTRASOUND COMPLETE COMPARISON:  01/07/2016 FINDINGS: Right Kidney: Length: 12.4 cm.  Echogenicity within normal limits. No mass. There is persistent moderate hydronephrosis with the renal pelvis measuring 12 mm. Left Kidney: Length: 11.4. Echogenicity within normal limits. No mass or hydronephrosis visualized. Bladder: Appears normal for degree of bladder distention. Bilateral ureteral jets are seen. IMPRESSION: Persistent right hydronephrosis, not unusual finding in the settings of  third trimester pregnancy. Normal appearance of the left kidney. Bilateral ureteral jets are seen within the urinary bladder. Electronically Signed   By: Ted Mcalpine M.D.   On: 01/25/2016 19:59  US Renal  Result Date: 01/07/2016 CLINICAL DATA:  RIGHT lower back pain for 4 days, hematuria, fever, [redacted] weeks pregnant EXAM: RENAL / URINARY TRACT ULTRASOUND COMPLETE COMPARISON:  None FINDINGS: Right Kidney: Length: 12.3 cm. Normal cortical thickness and echogenicity. Moderate hydronephrosis. No renal mass or shadowing calcification. No perinephric fluid. RIGHT renal pelvis 2.3 cm diameter. Left Kidney: Length: 10.6 cm. Less well visualized. No gross evidence of mass or hydronephrosis. Bladder: Normal appearance. LEFT ureteral jet visualized. Minimal RIGHT ureteral jet was noted. IMPRESSION: Moderate RIGHT hydronephrosis with renal pelvis 2.3 cm diameter. Diminished RIGHT ureteral jet versus LEFT. Distal RIGHT ureteral obstruction not excluded. Electronically Signed   By: Ulyses Southward M.D.   On: 01/07/2016 20:22    MDM Reactive fetal tracing, no contractions VSS U/a CBC, CMP, renal ultrasound Care turned over to [facility] FNP           Judeth Horn, NP 01/25/2016 8:19 PM   Discussed patient with Dr. Charlotta Newton discussed UA, renal US and other labs. Will admit for IV antibiotics, urine culture pending. Dr. Charlotta Newton to enter orders.   Duane Lope, NP 01/25/2016 9:18 PM   Assessment and Plan

## 2016-01-25 NOTE — MAU Note (Signed)
Was here a few wks ago with a kidney infec.  Just finished antibiotics.  When went to bathroom had pain again, it is worse.  Same side.

## 2016-01-25 NOTE — H&P (Signed)
Jillian Mcfarland is a 30 y.o. female, G2P1001 [redacted]w[redacted]d  weeks, presenting for admission for presumptive pyelonephritis.    Patient Active Problem List   Diagnosis Date Noted  . Pyelonephritis affecting pregnancy 01/07/2016  . Migraine syndrome 01/07/2016  . Rubella non-immune status, antepartum 01/07/2016      History of present pregnancy: Patient entered care at 10 5/7 weeks.   EDC of 03/30/16 was established by LMP, and congruent with [place] in 1st trimester.   Anatomy scan: 20 6/7 weeks, with limited anatomy and an anterior placenta.   Additional [place] evaluations:   23 6/7 weeks, for f/u anatomy:  EFW 1+8, 68%ile, normal fluid, normal cervical length Significant prenatal events:  On ATB (Macrobid) early pregnancy for positive urine culture with 100K Ecoli.  Repeat urine culture 12/15/15 showed 30K Ecoli, but no sx.  Was not treated at that time due to no sx. Last evaluation:  01/07/16 at office for problem visit of UTI sx, malaise, abdominal pain, fever.  BP 118/62, weight 209.  OB History    Gravida Para Term Preterm AB TAB SAB Ectopic Multiple Living   2008--SVB, 41 weeks, 8 hour labor, 7+3, [facility] at [facility]      Past Medical History  Diagnosis Date  . Vaginal Pap smear, abnormal         Past Surgical History  Procedure Laterality Date  . Adenoidectomy     Family History: MGM asthma, cancer,arthritis; MGM cancer, arthritis; Brother ankylosing spondylitis; PGM and PGF arthritis Social History:  reports that she quit smoking about 2 years ago. Her smoking use included Cigarettes. She has a 10 pack-year smoking history. She does not have any smokeless tobacco history on file. She reports that she does not drink alcohol or use illicit drugs.  Patient is Caucasian, of the [place] faith, has some college, employed as Chartered loss adjuster. Married to [employer], who is present and supportive.   Prenatal Transfer Tool  Maternal Diabetes:  Not tested  yet Genetic Screening: Normal first trimester screen and AFP Maternal Ultrasounds/Referrals: Normal Fetal Ultrasounds or other Referrals:  None Maternal Substance Abuse:  No Significant Maternal Medications:  None Significant Maternal Lab Results: None  TDAP NA Flu NA  ROS:  Right flank pain, fever, nausea, malaise  No Known Allergies    BP 111/62 (BP Location: Right Arm)   Pulse 72   Temp 98.2 F (36.8 C) (Oral)   Resp 18   Breastfeeding? Unknown    Physical Exam: Does not appear toxic, but face is flushed Chest clear Heart RRR without murmur, mild tachycardia Abd gravid, NT, FH 27 cm + CVAT on right side Pelvic: deferred Ext: WNL, no edema  FHR: Category 1 UCs:  None  Results for orders placed or performed during the hospital encounter of 01/25/16 (from the past 24 hour(s))  Urinalysis, Routine w reflex microscopic (not at [facility])     Status: Abnormal   Collection Time: 01/25/16  6:27 PM  Result Value Ref Range   Color, Urine YELLOW YELLOW   APPearance CLOUDY (A) CLEAR   Specific Gravity, Urine 1.010 1.005 - 1.030   pH 6.5 5.0 - 8.0   Glucose, UA NEGATIVE NEGATIVE mg/dL   Hgb urine dipstick SMALL (A) NEGATIVE   Bilirubin Urine NEGATIVE NEGATIVE   Ketones, ur 15 (A) NEGATIVE mg/dL   Protein, ur 30 (A) NEGATIVE mg/dL   Nitrite NEGATIVE NEGATIVE   Leukocytes, UA LARGE (A) NEGATIVE  Urine microscopic-add on     Status: Abnormal   Collection Time: 01/25/16  6:27 PM  Result Value Ref Range   Squamous Epithelial / LPF 0-5 (A) NONE SEEN   WBC, UA TOO NUMEROUS TO COUNT 0 - 5 WBC/hpf   RBC / HPF 0-5 0 - 5 RBC/hpf   Bacteria, UA FEW (A) NONE SEEN  CBC     Status: Abnormal   Collection Time: 01/25/16  7:05 PM  Result Value Ref Range   WBC 14.5 (H) 4.0 - 10.5 K/uL   RBC 4.01 3.87 - 5.11 MIL/uL   Hemoglobin 10.7 (L) 12.0 - 15.0 g/dL   HCT 05.1 (L) 10.2 - 11.1 %   MCV 81.3 78.0 - 100.0 fL   MCH 26.7 26.0 - 34.0 pg   MCHC 32.8 30.0 - 36.0 g/dL   RDW 73.5  67.0 - 14.1 %   Platelets 336 150 - 400 K/uL  Comprehensive metabolic panel     Status: Abnormal   Collection Time: 01/25/16  7:05 PM  Result Value Ref Range   Sodium 135 135 - 145 mmol/L   Potassium 4.0 3.5 - 5.1 mmol/L   Chloride 106 101 - 111 mmol/L   CO2 20 (L) 22 - 32 mmol/L   Glucose, Bld 84 65 - 99 mg/dL   BUN 8 6 - 20 mg/dL   Creatinine, Ser 0.30 0.44 - 1.00 mg/dL   Calcium 8.8 (L) 8.9 - 10.3 mg/dL   Total Protein 7.1 6.5 - 8.1 g/dL   Albumin 3.1 (L) 3.5 - 5.0 g/dL   AST 20 15 - 41 U/L   ALT 18 14 - 54 U/L   Alkaline Phosphatase 109 38 - 126 U/L   Total Bilirubin 0.2 (L) 0.3 - 1.2 mg/dL   GFR calc non Af Amer >60 >60 mL/min   GFR calc Af Amer >60 >60 mL/min   Anion gap 9 5 - 15   US Renal  Result Date: 01/25/2016 CLINICAL DATA:  Patient is [redacted] weeks pregnant with history of UTI and pyelonephritis. EXAM: RENAL / URINARY TRACT ULTRASOUND COMPLETE COMPARISON:  01/07/2016 FINDINGS: Right Kidney: Length: 12.4 cm. Echogenicity within normal limits. No mass. There is persistent moderate hydronephrosis with the renal pelvis measuring 12 mm. Left Kidney: Length: 11.4. Echogenicity within normal limits. No mass or hydronephrosis visualized. Bladder: Appears normal for degree of bladder distention. Bilateral ureteral jets are seen. IMPRESSION: Persistent right hydronephrosis, not unusual finding in the settings of third trimester pregnancy. Normal appearance of the left kidney. Bilateral ureteral jets are seen within the urinary bladder. Electronically Signed   By: Ted Mcalpine M.D.   On: 01/25/2016 19:59  US Renal  Result Date: 01/07/2016 CLINICAL DATA:  RIGHT lower back pain for 4 days, hematuria, fever, [redacted] weeks pregnant EXAM: RENAL / URINARY TRACT ULTRASOUND COMPLETE COMPARISON:  None FINDINGS: Right Kidney: Length: 12.3 cm. Normal cortical thickness and echogenicity. Moderate hydronephrosis. No renal mass or shadowing calcification. No perinephric fluid. RIGHT renal pelvis 2.3 cm  diameter. Left Kidney: Length: 10.6 cm. Less well visualized. No gross evidence of mass or hydronephrosis. Bladder: Normal appearance. LEFT ureteral jet visualized. Minimal RIGHT ureteral jet was noted. IMPRESSION: Moderate RIGHT hydronephrosis with renal pelvis 2.3 cm diameter. Diminished RIGHT ureteral jet versus LEFT. Distal RIGHT ureteral obstruction not excluded. Electronically Signed   By: Ulyses Southward M.D.   On: 01/07/2016 20:22    Prenatal labs: ABO, Rh:  O+ Antibody:  Neg Rubella:  Non-immune RPR:   NR HBsAg:  Neg HIV:   NR GBS:  NA Sickle cell/Hgb electrophoresis:  NA Pap:  WNL 2016 GC:  Negative 09/2015 Chlamydia:  Negative 09/2015 Genetic screenings:  Normal 1st trimester screen and AFP Glucola:  NA Other:   Hgb 12.9 at NOB   Assessment/Plan: IUP @ 29+5 Presumptive pyelonephritis    Plan: Admit to Antenatal per consult with Dr. Charlotta Newton Rocephin 2 gm IV q 24 hours IV hydration Pain control  Illene Bolus CNM 01/25/16 @ 2121

## 2016-01-26 LAB — CBC
HCT: 28.4 % — ABNORMAL LOW (ref 36.0–46.0)
Hemoglobin: 9.4 g/dL — ABNORMAL LOW (ref 12.0–15.0)
MCH: 27.1 pg (ref 26.0–34.0)
MCHC: 33.1 g/dL (ref 30.0–36.0)
MCV: 81.8 fL (ref 78.0–100.0)
Platelets: 296 10*3/uL (ref 150–400)
RBC: 3.47 MIL/uL — ABNORMAL LOW (ref 3.87–5.11)
RDW: 13.7 % (ref 11.5–15.5)
WBC: 8.3 10*3/uL (ref 4.0–10.5)

## 2016-01-26 NOTE — Progress Notes (Signed)
Pt states she feels much better.   BP (!) 109/59 (BP Location: Left Arm)   Pulse 80   Temp 98.2 F (36.8 C) (Tympanic)   Resp 16   Ht 5\' 6"  (1.676 m)   Wt 206 lb 4 oz (93.6 kg)   SpO2 100%   Breastfeeding? Unknown   BMI 33.29 kg/m  Physical Examination: General appearance - alert, well appearing, and in no distress Chest - clear to auscultation, no wheezes, rales or rhonchi, symmetric air entry Heart - normal rate and regular rhythm Abdomen - soft, nontender, nondistended, no masses or organomegaly Extremities - peripheral pulses normal, no pedal edema, no clubbing or cyanosis Pylenephritis Pt doing well and feeling better cx pending NST today coninue IFV and ABX

## 2016-01-27 DIAGNOSIS — O2300 Infections of kidney in pregnancy, unspecified trimester: Secondary | ICD-10-CM

## 2016-01-27 LAB — CULTURE, OB URINE: Culture: <10000 — AB

## 2016-01-27 MED ORDER — CEPHALEXIN 500 MG PO CAPS: 500.0000 mg | ORAL_CAPSULE | Freq: Two times a day (BID) | ORAL | Status: AC

## 2016-01-27 NOTE — Discharge Instructions (Signed)
Flank Pain °Flank pain is pain in your side. The flank is the area of your side between your upper belly (abdomen) and your back. Pain in this area can be caused by many different things. °HOME CARE °Home care and treatment will depend on the cause of your pain. °· Rest as told by your doctor. °· Drink enough fluids to keep your pee (urine) clear or pale yellow.   °· Only take medicine as told by your doctor. °· Tell your doctor about any changes in your pain. °· Follow up with your doctor. °GET HELP RIGHT AWAY IF:  °· Your pain does not get better with medicine.   °· You have new symptoms or your symptoms get worse. °· Your pain gets worse.   °· You have belly (abdominal) pain.   °· You are short of breath.   °· You always feel sick to your stomach (nauseous).   °· You keep throwing up (vomiting).   °· You have puffiness (swelling) in your belly.   °· You feel light-headed or you pass out (faint).   °· You have blood in your pee. °· You have a fever or lasting symptoms for more than 2-3 days. °· You have a fever and your symptoms suddenly get worse. °MAKE SURE YOU:  °· Understand these instructions. °· Will watch your condition. °· Will get help right away if you are not doing well or get worse. °  °This information is not intended to replace advice given to you by your health care provider. Make sure you discuss any questions you have with your health care provider. °  °Document Released: 03/29/2008 Document Revised: 07/11/2014 Document Reviewed: 02/02/2012 °Elsevier Interactive Patient Education ©2016 [employer] Inc[employer] ° °

## 2016-01-27 NOTE — Discharge Summary (Signed)
OB Discharge Summary     Patient Name: Jillian Mcfarland DOB: 08-Apr-1986 MRN: [id number]  Date of admission: 01/25/2016 Date of discharge: 01/27/2016  Admitting diagnosis: 29w symptoms of kidney infection       Discharge diagnosis:  1. [redacted] week EGA pregnancy.  2.  Pyelonephritis.                                                                                               Hospital course:    Patient presented at 29 weeks and 5 days EGA with right flank pain and also right-sided back pain. She reports this pain was similar to the pain she had when she had her prior pyelonephritis infection.  She received IV antibiotics- Ceftriaxone and her symptoms improved. She remained afebrile and was deemed stable for discharge on hospital day #3.  She was sent home with oral antibiotics- Cephalexin.    Physical exam  Vitals:   01/26/16 1707 01/26/16 2144 01/27/16 0519 01/27/16 0520  BP: (!) 108/55 119/68 (!) 94/57   Pulse: 78 83 74   Resp: Temp: 98.5 F (36.9 C) 98.1 F (36.7 C) 98 F (36.7 C)   TempSrc: Oral Oral Oral   SpO2: 100% 100% 99%   Weight:    94.7 kg (208 lb 12 oz)  Height:       General: alert, cooperative and no distress Back: No CVA tenderness bilaterally.  No back or flank pain.  7/25: NST: Cat 1.          TOCO: No contractions.   Labs: Lab Results  Component Value Date   WBC 8.3 01/26/2016   HGB 9.4 (L) 01/26/2016   HCT 28.4 (L) 01/26/2016   MCV 81.8 01/26/2016   PLT 296 01/26/2016   CMP Latest Ref Rng & Units 01/25/2016  Glucose 65 - 99 mg/dL 84  BUN 6 - 20 mg/dL 8  Creatinine 4.09 - 8.11 mg/dL 9.14  Sodium 782 - 956 mmol/L 135  Potassium 3.5 - 5.1 mmol/L 4.0  Chloride 101 - 111 mmol/L 106  CO2 22 - 32 mmol/L 20(L)  Calcium 8.9 - 10.3 mg/dL 2.1(H)  Total Protein 6.5 - 8.1 g/dL 7.1  Total Bilirubin 0.3 - 1.2 mg/dL 0.8(M)  Alkaline Phos 38 - 126 U/L 109  AST 15 - 41 U/L 20  ALT 14 - 54 U/L 18   Results for orders placed or performed during the  hospital encounter of 01/25/16  Culture, OB Urine     Status: Abnormal   Collection Time: 01/25/16  6:10 PM  Result Value Ref Range Status   Specimen Description OB CLEAN CATCH  Final   Special Requests NONE  Final   Culture (A)  Final    <10,000 COLONIES/mL INSIGNIFICANT GROWTH NO GROUP B STREP (S.AGALACTIAE) ISOLATED Performed at [facility]    Report Status 01/27/2016 FINAL  Final     Renal Ultrasound 01/25/16:   Study Result   CLINICAL DATA:  Patient is [redacted] weeks pregnant with history of UTI and pyelonephritis. EXAM: RENAL / URINARY TRACT ULTRASOUND COMPLETE COMPARISON:  01/07/2016  FINDINGS: Right Kidney: Length: 12.4 cm. Echogenicity within normal limits. No mass. There is persistent moderate hydronephrosis with the renal pelvis measuring 12 mm. Left Kidney: Length: 11.4. Echogenicity within normal limits. No mass or hydronephrosis visualized. Bladder: Appears normal for degree of bladder distention. Bilateral ureteral jets are seen. IMPRESSION: Persistent right hydronephrosis, not unusual finding in the settings of third trimester pregnancy. Normal appearance of the left kidney. Bilateral ureteral jets are seen within the urinary bladder. Electronically Signed   By: Ted Mcalpine M.D.   On: 01/25/2016 19:59     Discharge instruction: Pyelonephritis handout.  After visit meds:    Medication List    STOP taking these medications   nitrofurantoin (macrocrystal-monohydrate) 100 MG capsule Commonly known as:  MACROBID     TAKE these medications   acetaminophen 500 MG tablet Commonly known as:  TYLENOL Take 1,000 mg by mouth every 6 (six) hours as needed for mild pain.   calcium carbonate 500 MG chewable tablet Commonly known as:  TUMS - dosed in mg elemental calcium Chew 2 tablets by mouth as needed for indigestion or heartburn.   prenatal multivitamin Tabs tablet Take 1 tablet by mouth daily at 12 noon.      Cephalexin 500 mg tabs:  1 PO BID X 7 days.    Diet: routine diet  Activity: Advance as tolerated. Pelvic rest for 6 weeks.   Outpatient follow up:1 week  Disposition:Home.    01/27/2016 Konrad Felix, MD

## 2016-01-27 NOTE — Progress Notes (Signed)
  Pt. Was discharged in thee care of Aunt. Downstairs per ambulatory with N.T. Escort. No equpment needed for home use. Stable FHT= 140.No complains.

## 2016-01-28 ENCOUNTER — Encounter (HOSPITAL_COMMUNITY): Payer: Self-pay

## 2016-03-08 LAB — OB RESULTS CONSOLE GBS: STREP GROUP B AG: NEGATIVE

## 2016-03-31 ENCOUNTER — Inpatient Hospital Stay (HOSPITAL_COMMUNITY)
Admission: AD | Admit: 2016-03-31 | Discharge: 2016-04-02 | DRG: 775 | Disposition: A | Discharge status: Home / Self Care | Payer: Medicaid Other | Source: Ambulatory Visit | Attending: Obstetrics and Gynecology | Admitting: Obstetrics and Gynecology

## 2016-03-31 ENCOUNTER — Inpatient Hospital Stay (HOSPITAL_COMMUNITY): Payer: Medicaid Other | Admitting: Anesthesiology

## 2016-03-31 ENCOUNTER — Encounter (HOSPITAL_COMMUNITY): Payer: Self-pay | Admitting: *Deleted

## 2016-03-31 DIAGNOSIS — Z3A39 39 weeks gestation of pregnancy: Secondary | ICD-10-CM | POA: Diagnosis not present

## 2016-03-31 DIAGNOSIS — Z3483 Encounter for supervision of other normal pregnancy, third trimester: Secondary | ICD-10-CM | POA: Diagnosis present

## 2016-03-31 DIAGNOSIS — IMO0001 Reserved for inherently not codable concepts without codable children: Secondary | ICD-10-CM

## 2016-03-31 DIAGNOSIS — Z87891 Personal history of nicotine dependence: Secondary | ICD-10-CM | POA: Diagnosis not present

## 2016-03-31 LAB — TYPE AND SCREEN
ABO/RH(D): O POS
Antibody Screen: NEGATIVE

## 2016-03-31 LAB — CBC
HEMATOCRIT: 38.3 % (ref 36.0–46.0)
HEMOGLOBIN: 13.2 g/dL (ref 12.0–15.0)
MCH: 27 pg (ref 26.0–34.0)
MCHC: 34.5 g/dL (ref 30.0–36.0)
MCV: 78.5 fL (ref 78.0–100.0)
Platelets: 348 10*3/uL (ref 150–400)
RBC: 4.88 MIL/uL (ref 3.87–5.11)
RDW: 14.4 % (ref 11.5–15.5)
WBC: 15 10*3/uL — ABNORMAL HIGH (ref 4.0–10.5)

## 2016-03-31 LAB — URINALYSIS, ROUTINE W REFLEX MICROSCOPIC
BILIRUBIN URINE: NEGATIVE
Glucose, UA: NEGATIVE mg/dL
HGB URINE DIPSTICK: NEGATIVE
Ketones, ur: NEGATIVE mg/dL
Leukocytes, UA: NEGATIVE
Nitrite: NEGATIVE
PROTEIN: NEGATIVE mg/dL
Specific Gravity, Urine: 1.01 (ref 1.005–1.030)
pH: 6 (ref 5.0–8.0)

## 2016-03-31 MED ORDER — DIPHENHYDRAMINE HCL 25 MG PO CAPS: 25.0000 mg | ORAL_CAPSULE | Freq: Four times a day (QID) | ORAL | Status: DC | PRN

## 2016-03-31 MED ORDER — ONDANSETRON HCL 4 MG/2ML IJ SOLN: 4.0000 mg | INTRAMUSCULAR | Status: DC | PRN

## 2016-03-31 MED ORDER — DIPHENHYDRAMINE HCL 50 MG/ML IJ SOLN: 12.5000 mg | INTRAMUSCULAR | Status: DC | PRN

## 2016-03-31 MED ORDER — LACTATED RINGERS IV SOLN
INTRAVENOUS | Status: DC
  Administered 2016-03-31: 19:00:00 via INTRAVENOUS

## 2016-03-31 MED ORDER — LACTATED RINGERS IV SOLN: 500.0000 mL | Freq: Once | INTRAVENOUS | Status: DC

## 2016-03-31 MED ORDER — FENTANYL 2.5 MCG/ML BUPIVACAINE 1/10 % EPIDURAL INFUSION (WH - ANES)
INTRAMUSCULAR | Status: AC
  Filled 2016-03-31: qty 125

## 2016-03-31 MED ORDER — EPHEDRINE 5 MG/ML INJ
10.0000 mg | INTRAVENOUS | Status: DC | PRN
  Filled 2016-03-31: qty 4

## 2016-03-31 MED ORDER — LIDOCAINE HCL (PF) 1 % IJ SOLN
INTRAMUSCULAR | Status: DC | PRN
  Administered 2016-03-31: 5 mL
  Administered 2016-03-31: 3 mL
  Administered 2016-03-31: 2 mL

## 2016-03-31 MED ORDER — PRENATAL MULTIVITAMIN CH: 1.0000 | ORAL_TABLET | Freq: Every day | ORAL | Status: DC

## 2016-03-31 MED ORDER — ONDANSETRON HCL 4 MG/2ML IJ SOLN: 4.0000 mg | Freq: Four times a day (QID) | INTRAMUSCULAR | Status: DC | PRN

## 2016-03-31 MED ORDER — OXYCODONE-ACETAMINOPHEN 5-325 MG PO TABS: 2.0000 | ORAL_TABLET | ORAL | Status: DC | PRN

## 2016-03-31 MED ORDER — OXYTOCIN 40 UNITS IN LACTATED RINGERS INFUSION - SIMPLE MED
2.5000 [IU]/h | INTRAVENOUS | Status: DC
  Filled 2016-03-31: qty 1000

## 2016-03-31 MED ORDER — PHENYLEPHRINE 40 MCG/ML (10ML) SYRINGE FOR IV PUSH (FOR BLOOD PRESSURE SUPPORT): 80.0000 ug | PREFILLED_SYRINGE | INTRAVENOUS | Status: DC | PRN

## 2016-03-31 MED ORDER — OXYTOCIN BOLUS FROM INFUSION
500.0000 mL | Freq: Once | INTRAVENOUS | Status: AC
  Administered 2016-03-31: 500 mL via INTRAVENOUS

## 2016-03-31 MED ORDER — PHENYLEPHRINE 40 MCG/ML (10ML) SYRINGE FOR IV PUSH (FOR BLOOD PRESSURE SUPPORT)
80.0000 ug | PREFILLED_SYRINGE | INTRAVENOUS | Status: DC | PRN
  Filled 2016-03-31: qty 5

## 2016-03-31 MED ORDER — FERROUS SULFATE 325 (65 FE) MG PO TABS
325.0000 mg | ORAL_TABLET | Freq: Every day | ORAL | Status: DC
  Administered 2016-04-01 – 2016-04-02 (×2): 325 mg via ORAL
  Filled 2016-03-31 (×2): qty 1

## 2016-03-31 MED ORDER — ACETAMINOPHEN 325 MG PO TABS: 650.0000 mg | ORAL_TABLET | ORAL | Status: DC | PRN

## 2016-03-31 MED ORDER — IBUPROFEN 600 MG PO TABS
600.0000 mg | ORAL_TABLET | Freq: Four times a day (QID) | ORAL | Status: DC
  Administered 2016-04-01 – 2016-04-02 (×6): 600 mg via ORAL
  Filled 2016-03-31 (×6): qty 1

## 2016-03-31 MED ORDER — SOD CITRATE-CITRIC ACID 500-334 MG/5ML PO SOLN: 30.0000 mL | ORAL | Status: DC | PRN

## 2016-03-31 MED ORDER — PHENYLEPHRINE 40 MCG/ML (10ML) SYRINGE FOR IV PUSH (FOR BLOOD PRESSURE SUPPORT)
PREFILLED_SYRINGE | INTRAVENOUS | Status: AC
  Filled 2016-03-31: qty 20

## 2016-03-31 MED ORDER — BENZOCAINE-MENTHOL 20-0.5 % EX AERO
1.0000 "application " | INHALATION_SPRAY | CUTANEOUS | Status: DC | PRN
  Filled 2016-03-31: qty 56

## 2016-03-31 MED ORDER — PRENATAL MULTIVITAMIN CH
1.0000 | ORAL_TABLET | Freq: Every day | ORAL | Status: DC
  Administered 2016-04-01: 1 via ORAL
  Filled 2016-03-31: qty 1

## 2016-03-31 MED ORDER — FLEET ENEMA 7-19 GM/118ML RE ENEM: 1.0000 | ENEMA | RECTAL | Status: DC | PRN

## 2016-03-31 MED ORDER — WITCH HAZEL-GLYCERIN EX PADS: 1.0000 "application " | MEDICATED_PAD | CUTANEOUS | Status: DC | PRN

## 2016-03-31 MED ORDER — ZOLPIDEM TARTRATE 5 MG PO TABS: 5.0000 mg | ORAL_TABLET | Freq: Every evening | ORAL | Status: DC | PRN

## 2016-03-31 MED ORDER — COCONUT OIL OIL: 1.0000 "application " | TOPICAL_OIL | Status: DC | PRN

## 2016-03-31 MED ORDER — SENNOSIDES-DOCUSATE SODIUM 8.6-50 MG PO TABS
2.0000 | ORAL_TABLET | ORAL | Status: DC
  Administered 2016-04-01 – 2016-04-02 (×2): 2 via ORAL
  Filled 2016-03-31 (×2): qty 2

## 2016-03-31 MED ORDER — OXYCODONE-ACETAMINOPHEN 5-325 MG PO TABS: 1.0000 | ORAL_TABLET | ORAL | Status: DC | PRN

## 2016-03-31 MED ORDER — DIBUCAINE 1 % RE OINT: 1.0000 "application " | TOPICAL_OINTMENT | RECTAL | Status: DC | PRN

## 2016-03-31 MED ORDER — SIMETHICONE 80 MG PO CHEW: 80.0000 mg | CHEWABLE_TABLET | ORAL | Status: DC | PRN

## 2016-03-31 MED ORDER — EPHEDRINE 5 MG/ML INJ: 10.0000 mg | INTRAVENOUS | Status: DC | PRN

## 2016-03-31 MED ORDER — FENTANYL 2.5 MCG/ML BUPIVACAINE 1/10 % EPIDURAL INFUSION (WH - ANES)
14.0000 mL/h | INTRAMUSCULAR | Status: DC | PRN
Start: 2016-03-31 — End: 2016-03-31
  Administered 2016-03-31: 14 mL/h via EPIDURAL

## 2016-03-31 MED ORDER — ONDANSETRON HCL 4 MG PO TABS: 4.0000 mg | ORAL_TABLET | ORAL | Status: DC | PRN

## 2016-03-31 MED ORDER — LACTATED RINGERS IV SOLN: 500.0000 mL | INTRAVENOUS | Status: DC | PRN

## 2016-03-31 MED ORDER — CALCIUM CARBONATE ANTACID 500 MG PO CHEW
2.0000 | CHEWABLE_TABLET | ORAL | Status: DC | PRN
Start: 2016-03-31 — End: 2016-04-02

## 2016-03-31 MED ORDER — TETANUS-DIPHTH-ACELL PERTUSSIS 5-2.5-18.5 LF-MCG/0.5 IM SUSP: 0.5000 mL | Freq: Once | INTRAMUSCULAR | Status: DC

## 2016-03-31 MED ORDER — LIDOCAINE HCL (PF) 1 % IJ SOLN
30.0000 mL | INTRAMUSCULAR | Status: DC | PRN
  Filled 2016-03-31: qty 30

## 2016-03-31 NOTE — Anesthesia Preprocedure Evaluation (Signed)
Anesthesia Evaluation  Patient identified by MRN, date of birth, ID band Patient awake    Reviewed: Allergy & Precautions, Patient's Chart, lab work & pertinent test results  Airway Mallampati: II  TM Distance: >3 FB Neck ROM: Full    Dental   Pulmonary former smoker,    breath sounds clear to auscultation       Cardiovascular  Rhythm:Regular Rate:Normal     Neuro/Psych    GI/Hepatic   Endo/Other    Renal/GU      Musculoskeletal   Abdominal   Peds  Hematology   Anesthesia Other Findings   Reproductive/Obstetrics (+) Pregnancy                             Lab Results  Component Value Date   WBC 15.0 (H) 03/31/2016   HGB 13.2 03/31/2016   HCT 38.3 03/31/2016   MCV 78.5 03/31/2016   PLT 348 03/31/2016    Anesthesia Physical Anesthesia Plan  ASA: II  Anesthesia Plan: Epidural   Post-op Pain Management:    Induction:   Airway Management Planned: Natural Airway  Additional Equipment:   Intra-op Plan:   Post-operative Plan:   Informed Consent: I have reviewed the patients History and Physical, chart, labs and discussed the procedure including the risks, benefits and alternatives for the proposed anesthesia with the patient or authorized representative who has indicated his/her understanding and acceptance.     Plan Discussed with:   Anesthesia Plan Comments:         Anesthesia Quick Evaluation

## 2016-03-31 NOTE — Anesthesia Procedure Notes (Signed)
Epidural Patient location during procedure: OB  Staffing Anesthesiologist: Marcene DuosFITZGERALD, Jadier Rockers Performed: anesthesiologist   Preanesthetic Checklist Completed: patient identified, site marked, surgical consent, pre-op evaluation, timeout performed, IV checked, risks and benefits discussed and monitors and equipment checked  Epidural Patient position: sitting Prep: site prepped and draped and DuraPrep Patient monitoring: continuous pulse ox and blood pressure Approach: midline Location: L4-L5 Injection technique: LOR saline  Needle:  Needle type: Tuohy  Needle gauge: 17 G Needle length: 9 cm and 9 Needle insertion depth: 6 cm Catheter type: closed end flexible Catheter size: 19 Gauge Catheter at skin depth: 13 (11cm initially. Advanced to 13cm when laid in lat decubitus position.) cm Test dose: negative  Assessment Events: blood not aspirated, injection not painful, no injection resistance, negative IV test and no paresthesia

## 2016-03-31 NOTE — MAU Note (Signed)
C/o ucs since 0900 this AM; denies any SROM or vaginal bleeding; had membranes stripped yesterday;

## 2016-04-01 LAB — RPR: RPR: NONREACTIVE

## 2016-04-01 LAB — ABO/RH: ABO/RH(D): O POS

## 2016-04-01 LAB — BIRTH TISSUE RECOVERY COLLECTION (PLACENTA DONATION)

## 2016-04-01 MED ORDER — MEASLES, MUMPS & RUBELLA VAC ~~LOC~~ INJ
0.5000 mL | INJECTION | Freq: Once | SUBCUTANEOUS | Status: AC
  Administered 2016-04-02: 0.5 mL via SUBCUTANEOUS
  Filled 2016-04-01: qty 0.5

## 2016-04-01 NOTE — Discharge Summary (Signed)
[facility] [place] Discharge Summary   Patient Name:   Jillian Mcfarland DOB:     [birth year] MRN:     [id number]  Date of Admission:   03/31/2016 Date of Discharge:  04/02/2016  Admitting diagnosis:    39WKS CTX 3MINS APART Principal Problem:   Vaginal delivery Active Problems:   First degree perineal laceration during delivery  Term Pregnancy Delivered    Discharge diagnosis:    39WKS CTX 3MINS APART Principal Problem:   Vaginal delivery Active Problems:   First degree perineal laceration during delivery  Term Pregnancy Delivered                                                                     Post partum procedures: None  Type of Delivery:  SVB  Delivering Provider: Larey Days   Date of Delivery:  03/31/16  Newborn Data:    Live born female  Birth Weight: 6 lb 8.2 oz (2955 g) APGARS: 9, 9  Baby's Name:              Alver Sorrow Feeding:   Breast Disposition:   home with mother  Complications:   None  Hospital course:      Onset of Labor With Vaginal Delivery     30 y.o. yo G2P2001 at 34w2dwas admitted in Active Labor on 03/31/2016. Patient had an uncomplicated labor course as follows:  Membrane Rupture Time/Date: 6:00 PM ,03/31/2016   Intrapartum Procedures: Episiotomy: None [1]                                         Lacerations:  1st degree [2];Vaginal [6]  Patient had a delivery of a Viable infant. 03/31/2016  Information for the patient's newborn:  GElim, Peale[[[id number]] Delivery Method: Vaginal, Spontaneous Delivery (Filed from Delivery Summary)    Patient had an uncomplicated postpartum course. She is ambulating, tolerating a regular diet, passing flatus, and urinating well. She received MMR. Patient is discharged home in stable condition on 04/02/16.  Physical Exam:   Vitals:   04/01/16 0415 04/01/16 1130 04/01/16 1822 04/02/16 0607  BP: (!) 113/51 115/70 105/67 122/81  Pulse: 66 80 75 79  Resp: '16 20 18 18  '$ Temp: 98.6 F  (37 C) 98.2 F (36.8 C) 98.3 F (36.8 C) 98.3 F (36.8 C)  TempSrc: Axillary Oral Oral Oral  SpO2:  100%    Weight:      Height:       General: alert, cooperative and no distress. Lochia: appropriate. Uterine Fundus: firm. Incision: Healing well with no significant drainage. DVT Evaluation: No evidence of DVT seen on physical exam. Negative Homan's sign. No cords or calf tenderness. No significant calf/ankle edema.  Labs: Lab Results  Component Value Date   WBC 15.0 (H) 03/31/2016   HGB 13.2 03/31/2016   HCT 38.3 03/31/2016   MCV 78.5 03/31/2016   PLT 348 03/31/2016   CMP Latest Ref Rng & Units 01/25/2016  Glucose 65 - 99 mg/dL 84  BUN 6 - 20 mg/dL 8  Creatinine 0.44 - 1.00 mg/dL 0.53  Sodium 135 - 145 mmol/L 135  Potassium 3.5 - 5.1 mmol/L 4.0  Chloride 101 - 111 mmol/L 106  CO2 22 - 32 mmol/L 20(L)  Calcium 8.9 - 10.3 mg/dL 8.8(L)  Total Protein 6.5 - 8.1 g/dL 7.1  Total Bilirubin 0.3 - 1.2 mg/dL 0.2(L)  Alkaline Phos 38 - 126 U/L 109  AST 15 - 41 U/L 20  ALT 14 - 54 U/L 18   Prenatal labs: ABO, Rh: O+ Antibody: Neg Rubella: Non-immune RPR: NR HBsAg: Neg HIV: NR GBS: NA Sickle cell/Hgb electrophoresis: NA Pap: WNL 2016 GC: Negative 09/2015 Chlamydia: Negative 09/2015 Genetic screenings: Normal 1st trimester screen and AFP Glucola: NA Hgb 12.9 at NOB, 11.6 at 28 wks  Discharge instruction: per After Visit Summary and "Baby and Me Booklet".  After Visit Meds:    Medication List    TAKE these medications   calcium carbonate 500 MG chewable tablet Commonly known as:  TUMS - dosed in mg elemental calcium Chew 2 tablets by mouth as needed for indigestion or heartburn.   ferrous sulfate 325 (65 FE) MG tablet Take 325 mg by mouth daily with breakfast.   ibuprofen 600 MG tablet Commonly known as:  ADVIL,MOTRIN Take 1 tablet (600 mg total) by mouth every 6 (six) hours as needed for fever, headache, mild pain, moderate pain or cramping.    prenatal multivitamin Tabs tablet Take 1 tablet by mouth daily at 12 noon.       Diet: routine diet  Activity: Advance as tolerated. Pelvic rest for 6 weeks.   Outpatient follow up:6 weeks  Postpartum contraception: Undecided  04/02/2016 Farrel Gordon, CNM

## 2016-04-01 NOTE — Lactation Note (Addendum)
This note was copied from a baby's chart. Lactation Consultation Note  Patient Name: Jillian Mcfarland ZOXWR'UToday's Date: 04/01/2016 Reason for consult: Follow-up assessment Baby 23 hours old. Mom reports that she gave baby 10 ml of formula 2 hours ago and baby sleepy now. Offered to assist with latch but mom declined stating that she really had questions about pumping. Mom stated that it was fine to discuss BF in front of visitors. Mom reports that baby would not latch well earlier. Mom reports that she did not use NS earlier, and then did not supplement with shield--giving bottle instead--because she thought it was too much trouble. Mom reports baby sleepy at breast without the shield. Enc mom to use NS as needed, and discussed supplementing in the NS because this keeps the baby at the breast. Discussed supply and demand and progression of milk coming to volume. Discussed methods of moving away from using the nipple shield as mom's milk comes to volume.  Mom states that she has not used the DEBP. Offered to assist mom to pump, but she declined stating that she knows how. Discussed EBM storage guidelines. Enc mom to put baby to breast using the NS, then supplement baby with EBM/formula using supplementation guidelines. Enc mom to post-pump followed by hand expression. Discussed supplementing in the NS, and mom reports that it makes sense to her now. Enc mom to call her bedside RN for help as needed with latching, pumping/supplementing.    Maternal Data    Feeding Feeding Type: Bottle Fed - Formula  LATCH Score/Interventions                      Lactation Tools Discussed/Used     Consult Status Consult Status: Follow-up Date: 04/02/16 Follow-up type: In-patient    Sherlyn HayJennifer D Helmuth Recupero 04/01/2016, 7:58 PM

## 2016-04-01 NOTE — Lactation Note (Signed)
This note was copied from a baby's chart. Lactation Consultation Note  Patient Name: Jillian Mcfarland RUEAV'WToday's Date: 04/01/2016 Reason for consult: Initial assessment   With this mom of a term baby, now 3317 hours old. This is mom's first time breast feeding. She is having trouble latching baby, and has fed 15 ml's of formula a few hours ago.Mom has a flat nipple on the right, which the baby latched with a 20 nipple shield, filled with 5 ml's of formula. The baby then latched easily to left breast, and stayed latched with strong, rhythmic suckles, to 17 minutes, and then unlatched  himself. Baby content at this time. I did show mom how to hand express and spoon feed [name], and he took about 2 ml's of colostrum. Mom very private when breast feeding, and had her 29 year old son stay behind the curtain throughout the consult. Mom may call for lactation help with latching. I did begin a DEP for mom to pump at least 4 times a day, a long as baby is latching well .    Maternal Data Formula Feeding for Exclusion: No Has patient been taught Hand Expression?: Yes Does the patient have breastfeeding experience prior to this delivery?: No  Feeding Feeding Type: Breast Fed  LATCH Score/Interventions Latch: Repeated attempts needed to sustain latch, nipple held in mouth throughout feeding, stimulation needed to elicit sucking reflex. (did better with 20 nipple shield) Intervention(s): Adjust position;Assist with latch;Breast compression  Audible Swallowing: A few with stimulation Intervention(s): Hand expression  Type of Nipple: Flat  Comfort (Breast/Nipple): Soft / non-tender     Hold (Positioning): Assistance needed to correctly position infant at breast and maintain latch. Intervention(s): Breastfeeding basics reviewed;Support Pillows;Position options;Skin to skin  LATCH Score: 6  Lactation Tools Discussed/Used Pump Review: Setup, frequency, and cleaning;Milk Storage;Other (comment) (hand  expression taught) Initiated by:: Danton Claphristine Cosmo Tetreault RN IBCLC Date initiated:: 04/01/16   Consult Status Consult Status: Follow-up Date: 04/02/16 Follow-up type: In-patient    Alfred LevinsLee, Hassan Blackshire Anne 04/01/2016, 4:23 PM

## 2016-04-01 NOTE — Progress Notes (Signed)
Subjective: Postpartum Day 1: Vaginal delivery,  laceration Patient up ad lib, reports no syncope or dizziness. Feeding:  breast Contraceptive plan:  Breast using nipple shields  Objective: Vital signs in last 24 hours: Temp:  [97.4 F (36.3 C)-98.6 F (37 C)] 98.6 F (37 C) (09/29 0415) Pulse Rate:  [66-161] 66 (09/29 0415) Resp:  [16-20] 16 (09/29 0415) BP: (77-145)/(26-125) 113/51 (09/29 0415) SpO2:  [100 %] 100 % (09/28 1935) Weight:  [97.5 kg (215 lb)] 97.5 kg (215 lb) (09/28 1853)  Physical Exam:  General: alert and no distress Lochia: appropriate Uterine Fundus: firm Perineum: healing well DVT Evaluation: No evidence of DVT seen on physical exam.   CBC Latest Ref Rng & Units 03/31/2016 01/26/2016 01/25/2016  WBC 4.0 - 10.5 K/uL 15.0(H) 8.3 14.5(H)  Hemoglobin 12.0 - 15.0 g/dL 69.613.2 2.9(B9.4(L) 10.7(L)  Hematocrit 36.0 - 46.0 % 38.3 28.4(L) 32.6(L)  Platelets 150 - 400 K/uL 348 296 336     Assessment/Plan: Status post vaginal delivery day 1 Stable Continue current care. Plan for discharge tomorrow, Breastfeeding and Lactation consult    Henderson Newcomerancy Jean ProtheroCNM 04/01/2016, 10:33 AM

## 2016-04-01 NOTE — Discharge Instructions (Signed)
Contraception Choices Contraception (birth control) is the use of any methods or devices to prevent pregnancy. Below are some methods to help avoid pregnancy. HORMONAL METHODS   Contraceptive implant. This is a thin, plastic tube containing progesterone hormone. It does not contain estrogen hormone. Your health care provider inserts the tube in the inner part of the upper arm. The tube can remain in place for up to 3 years. After 3 years, the implant must be removed. The implant prevents the ovaries from releasing an egg (ovulation), thickens the cervical mucus to prevent sperm from entering the uterus, and thins the lining of the inside of the uterus.  Progesterone-only injections. These injections are given every 3 months by your health care provider to prevent pregnancy. This synthetic progesterone hormone stops the ovaries from releasing eggs. It also thickens cervical mucus and changes the uterine lining. This makes it harder for sperm to survive in the uterus.  Birth control pills. These pills contain estrogen and progesterone hormone. They work by preventing the ovaries from releasing eggs (ovulation). They also cause the cervical mucus to thicken, preventing the sperm from entering the uterus. Birth control pills are prescribed by a health care provider.Birth control pills can also be used to treat heavy periods.  Minipill. This type of birth control pill contains only the progesterone hormone. They are taken every day of each month and must be prescribed by your health care provider.  Birth control patch. The patch contains hormones similar to those in birth control pills. It must be changed once a week and is prescribed by a health care provider.  Vaginal ring. The ring contains hormones similar to those in birth control pills. It is left in the vagina for 3 weeks, removed for 1 week, and then a new one is put back in place. The patient must be comfortable inserting and removing the ring  from the vagina.A health care provider's prescription is necessary.  Emergency contraception. Emergency contraceptives prevent pregnancy after unprotected sexual intercourse. This pill can be taken right after sex or up to 5 days after unprotected sex. It is most effective the sooner you take the pills after having sexual intercourse. Most emergency contraceptive pills are available without a prescription. Check with your pharmacist. Do not use emergency contraception as your only form of birth control. BARRIER METHODS   Female condom. This is a thin sheath (latex or rubber) that is worn over the penis during sexual intercourse. It can be used with spermicide to increase effectiveness.  Female condom. This is a soft, loose-fitting sheath that is put into the vagina before sexual intercourse.  Diaphragm. This is a soft, latex, dome-shaped barrier that must be fitted by a health care provider. It is inserted into the vagina, along with a spermicidal jelly. It is inserted before intercourse. The diaphragm should be left in the vagina for 6 to 8 hours after intercourse.  Cervical cap. This is a round, soft, latex or plastic cup that fits over the cervix and must be fitted by a health care provider. The cap can be left in place for up to 48 hours after intercourse.  Sponge. This is a soft, circular piece of polyurethane foam. The sponge has spermicide in it. It is inserted into the vagina after wetting it and before sexual intercourse.  Spermicides. These are chemicals that kill or block sperm from entering the cervix and uterus. They come in the form of creams, jellies, suppositories, foam, or tablets. They do not require a  prescription. They are inserted into the vagina with an applicator before having sexual intercourse. The process must be repeated every time you have sexual intercourse. INTRAUTERINE CONTRACEPTION  Intrauterine device (IUD). This is a T-shaped device that is put in a woman's uterus  during a menstrual period to prevent pregnancy. There are 2 types:  Copper IUD. This type of IUD is wrapped in copper wire and is placed inside the uterus. Copper makes the uterus and fallopian tubes produce a fluid that kills sperm. It can stay in place for 10 years.  Hormone IUD. This type of IUD contains the hormone progestin (synthetic progesterone). The hormone thickens the cervical mucus and prevents sperm from entering the uterus, and it also thins the uterine lining to prevent implantation of a fertilized egg. The hormone can weaken or kill the sperm that get into the uterus. It can stay in place for 3-5 years, depending on which type of IUD is used. PERMANENT METHODS OF CONTRACEPTION  Female tubal ligation. This is when the woman's fallopian tubes are surgically sealed, tied, or blocked to prevent the egg from traveling to the uterus.  Hysteroscopic sterilization. This involves placing a small coil or insert into each fallopian tube. Your doctor uses a technique called hysteroscopy to do the procedure. The device causes scar tissue to form. This results in permanent blockage of the fallopian tubes, so the sperm cannot fertilize the egg. It takes about 3 months after the procedure for the tubes to become blocked. You must use another form of birth control for these 3 months.  Female sterilization. This is when the female has the tubes that carry sperm tied off (vasectomy).This blocks sperm from entering the vagina during sexual intercourse. After the procedure, the man can still ejaculate fluid (semen). NATURAL PLANNING METHODS  Natural family planning. This is not having sexual intercourse or using a barrier method (condom, diaphragm, cervical cap) on days the woman could become pregnant.  Calendar method. This is keeping track of the length of each menstrual cycle and identifying when you are fertile.  Ovulation method. This is avoiding sexual intercourse during ovulation.  Symptothermal  method. This is avoiding sexual intercourse during ovulation, using a thermometer and ovulation symptoms.  Post-ovulation method. This is timing sexual intercourse after you have ovulated. Regardless of which type or method of contraception you choose, it is important that you use condoms to protect against the transmission of sexually transmitted infections (STIs). Talk with your health care provider about which form of contraception is most appropriate for you.   This information is not intended to replace advice given to you by your health care provider. Make sure you discuss any questions you have with your health care provider.   Document Released: 06/20/2005 Document Revised: 06/25/2013 Document Reviewed: 12/13/2012 Elsevier Interactive Patient Education 2016 [employer] Breastfeeding and Mastitis Mastitis is inflammation of the breast tissue. It can occur in women who are breastfeeding. This can make breastfeeding painful. Mastitis will sometimes go away on its own. Your health care provider will help determine if treatment is needed. CAUSES Mastitis is often associated with a blocked milk (lactiferous) duct. This can happen when too much milk builds up in the breast. Causes of excess milk in the breast can include:  Poor latch-on. If your baby is not latched onto the breast properly, she or he may not empty your breast completely while breastfeeding.  Allowing too much time to pass between feedings.  Wearing a bra or other clothing that is  too tight. This puts extra pressure on the lactiferous ducts so milk does not flow through them as it should. Mastitis can also be caused by a bacterial infection. Bacteria may enter the breast tissue through cuts or openings in the skin. In women who are breastfeeding, this may occur because of cracked or irritated skin. Cracks in the skin are often caused when your baby does not latch on properly to the breast. SIGNS AND SYMPTOMS  Swelling,  redness, tenderness, and pain in an area of the breast.  Swelling of the glands under the arm on the same side.  Fever may or may not accompany mastitis. If an infection is allowed to progress, a collection of pus (abscess) may develop. DIAGNOSIS  Your health care provider can usually diagnose mastitis based on your symptoms and a physical exam. Tests may be done to help confirm the diagnosis. These may include:  Removal of pus from the breast by applying pressure to the area. This pus can be examined in the lab to determine which bacteria are present. If an abscess has developed, the fluid in the abscess can be removed with a needle. This can also be used to confirm the diagnosis and determine the bacteria present. In most cases, pus will not be present.  Blood tests to determine if your body is fighting a bacterial infection.  Mammogram or ultrasound tests to rule out other problems or diseases. TREATMENT  Mastitis that occurs with breastfeeding will sometimes go away on its own. Your health care provider may choose to wait 24 hours after first seeing you to decide whether a prescription medicine is needed. If your symptoms are worse after 24 hours, your health care provider will likely prescribe an antibiotic medicine to treat the mastitis. He or she will determine which bacteria are most likely causing the infection and will then select an appropriate antibiotic medicine. This is sometimes changed based on the results of tests performed to identify the bacteria, or if there is no response to the antibiotic medicine selected. Antibiotic medicines are usually given by mouth. You may also be given medicine for pain. HOME CARE INSTRUCTIONS  Only take over-the-counter or prescription medicines for pain, fever, or discomfort as directed by your health care provider.  If your health care provider prescribed an antibiotic medicine, take the medicine as directed. Make sure you finish it even if you  start to feel better.  Do not wear a tight or underwire bra. Wear a soft, supportive bra.  Increase your fluid intake, especially if you have a fever.  Continue to empty the breast. Your health care provider can tell you whether this milk is safe for your infant or needs to be thrown out. You may be told to stop nursing until your health care provider thinks it is safe for your baby. Use a breast pump if you are advised to stop nursing.  Keep your nipples clean and dry.  Empty the first breast completely before going to the other breast. If your baby is not emptying your breasts completely for some reason, use a breast pump to empty your breasts.  If you go back to work, pump your breasts while at work to stay in time with your nursing schedule.  Avoid allowing your breasts to become overly filled with milk (engorged). SEEK MEDICAL CARE IF:  You have pus-like discharge from the breast.  Your symptoms do not improve with the treatment prescribed by your health care provider within 2 days. SEEK IMMEDIATE  MEDICAL CARE IF:  Your pain and swelling are getting worse.  You have pain that is not controlled with medicine.  You have a red line extending from the breast toward your armpit.  You have a fever or persistent symptoms for more than 2-3 days.  You have a fever and your symptoms suddenly get worse. MAKE SURE YOU:   Understand these instructions.  Will watch your condition.  Will get help right away if you are not doing well or get worse.   This information is not intended to replace advice given to you by your health care provider. Make sure you discuss any questions you have with your health care provider.   Document Released: 10/15/2004 Document Revised: 06/25/2013 Document Reviewed: 01/24/2013 Elsevier Interactive Patient Education [employer]. Breastfeeding Deciding to breastfeed is one of the best choices you can make for you and your baby. A change in hormones  during pregnancy causes your breast tissue to grow and increases the number and size of your milk ducts. These hormones also allow proteins, sugars, and fats from your blood supply to make breast milk in your milk-producing glands. Hormones prevent breast milk from being released before your baby is born as well as prompt milk flow after birth. Once breastfeeding has begun, thoughts of your baby, as well as his or her sucking or crying, can stimulate the release of milk from your milk-producing glands.  BENEFITS OF BREASTFEEDING For Your Baby  Your first milk (colostrum) helps your baby's digestive system function better.  There are antibodies in your milk that help your baby fight off infections.  Your baby has a lower incidence of asthma, allergies, and sudden infant death syndrome.  The nutrients in breast milk are better for your baby than infant formulas and are designed uniquely for your baby's needs.  Breast milk improves your baby's brain development.  Your baby is less likely to develop other conditions, such as childhood obesity, asthma, or type 2 diabetes mellitus. For You  Breastfeeding helps to create a very special bond between you and your baby.  Breastfeeding is convenient. Breast milk is always available at the correct temperature and costs nothing.  Breastfeeding helps to burn calories and helps you lose the weight gained during pregnancy.  Breastfeeding makes your uterus contract to its prepregnancy size faster and slows bleeding (lochia) after you give birth.   Breastfeeding helps to lower your risk of developing type 2 diabetes mellitus, osteoporosis, and breast or ovarian cancer later in life. SIGNS THAT YOUR BABY IS HUNGRY Early Signs of Hunger  Increased alertness or activity.  Stretching.  Movement of the head from side to side.  Movement of the head and opening of the mouth when the corner of the mouth or cheek is stroked (rooting).  Increased sucking  sounds, smacking lips, cooing, sighing, or squeaking.  Hand-to-mouth movements.  Increased sucking of fingers or hands. Late Signs of Hunger  Fussing.  Intermittent crying. Extreme Signs of Hunger Signs of extreme hunger will require calming and consoling before your baby will be able to breastfeed successfully. Do not wait for the following signs of extreme hunger to occur before you initiate breastfeeding:  Restlessness.  A loud, strong cry.  Screaming. BREASTFEEDING BASICS Breastfeeding Initiation  Find a comfortable place to sit or lie down, with your neck and back well supported.  Place a pillow or rolled up blanket under your baby to bring him or her to the level of your breast (if you are  seated). Nursing pillows are specially designed to help support your arms and your baby while you breastfeed.  Make sure that your baby's abdomen is facing your abdomen.  Gently massage your breast. With your fingertips, massage from your chest wall toward your nipple in a circular motion. This encourages milk flow. You may need to continue this action during the feeding if your milk flows slowly.  Support your breast with 4 fingers underneath and your thumb above your nipple. Make sure your fingers are well away from your nipple and your baby's mouth.  Stroke your baby's lips gently with your finger or nipple.  When your baby's mouth is open wide enough, quickly bring your baby to your breast, placing your entire nipple and as much of the colored area around your nipple (areola) as possible into your baby's mouth.  More areola should be visible above your baby's upper lip than below the lower lip.  Your baby's tongue should be between his or her lower gum and your breast.  Ensure that your baby's mouth is correctly positioned around your nipple (latched). Your baby's lips should create a seal on your breast and be turned out (everted).  It is common for your baby to suck about 2-3  minutes in order to start the flow of breast milk. Latching Teaching your baby how to latch on to your breast properly is very important. An improper latch can cause nipple pain and decreased milk supply for you and poor weight gain in your baby. Also, if your baby is not latched onto your nipple properly, he or she may swallow some air during feeding. This can make your baby fussy. Burping your baby when you switch breasts during the feeding can help to get rid of the air. However, teaching your baby to latch on properly is still the best way to prevent fussiness from swallowing air while breastfeeding. Signs that your baby has successfully latched on to your nipple:  Silent tugging or silent sucking, without causing you pain.  Swallowing heard between every 3-4 sucks.  Muscle movement above and in front of his or her ears while sucking. Signs that your baby has not successfully latched on to nipple:  Sucking sounds or smacking sounds from your baby while breastfeeding.  Nipple pain. If you think your baby has not latched on correctly, slip your finger into the corner of your baby's mouth to break the suction and place it between your baby's gums. Attempt breastfeeding initiation again. Signs of Successful Breastfeeding Signs from your baby:  A gradual decrease in the number of sucks or complete cessation of sucking.  Falling asleep.  Relaxation of his or her body.  Retention of a small amount of milk in his or her mouth.  Letting go of your breast by himself or herself. Signs from you:  Breasts that have increased in firmness, weight, and size 1-3 hours after feeding.  Breasts that are softer immediately after breastfeeding.  Increased milk volume, as well as a change in milk consistency and color by the fifth day of breastfeeding.  Nipples that are not sore, cracked, or bleeding. Signs That Your [employer] is Getting Enough Milk  Wetting at least 3 diapers in a 24-hour period. The  urine should be clear and pale yellow by age 40 days.  At least 3 stools in a 24-hour period by age 40 days. The stool should be soft and yellow.  At least 3 stools in a 24-hour period by age 23 days. The  stool should be seedy and yellow.  No loss of weight greater than 10% of birth weight during the first 34 days of age.  Average weight gain of 4-7 ounces (113-198 g) per week after age 18 days.  Consistent daily weight gain by age 7 days, without weight loss after the age of 2 weeks. After a feeding, your baby may spit up a small amount. This is common. BREASTFEEDING FREQUENCY AND DURATION Frequent feeding will help you make more milk and can prevent sore nipples and breast engorgement. Breastfeed when you feel the need to reduce the fullness of your breasts or when your baby shows signs of hunger. This is called "breastfeeding on demand." Avoid introducing a pacifier to your baby while you are working to establish breastfeeding (the first 4-6 weeks after your baby is born). After this time you may choose to use a pacifier. Research has shown that pacifier use during the first year of a baby's life decreases the risk of sudden infant death syndrome (SIDS). Allow your baby to feed on each breast as long as he or she wants. Breastfeed until your baby is finished feeding. When your baby unlatches or falls asleep while feeding from the first breast, offer the second breast. Because newborns are often sleepy in the first few weeks of life, you may need to awaken your baby to get him or her to feed. Breastfeeding times will vary from baby to baby. However, the following rules can serve as a guide to help you ensure that your baby is properly fed:  Newborns (babies 11 weeks of age or younger) may breastfeed every 1-3 hours.  Newborns should not go longer than 3 hours during the day or 5 hours during the night without breastfeeding.  You should breastfeed your baby a minimum of 8 times in a 24-hour period  until you begin to introduce solid foods to your baby at around 37 months of age. BREAST MILK PUMPING Pumping and storing breast milk allows you to ensure that your baby is exclusively fed your breast milk, even at times when you are unable to breastfeed. This is especially important if you are going back to work while you are still breastfeeding or when you are not able to be present during feedings. Your lactation consultant can give you guidelines on how long it is safe to store breast milk. A breast pump is a machine that allows you to pump milk from your breast into a sterile bottle. The pumped breast milk can then be stored in a refrigerator or freezer. Some breast pumps are operated by hand, while others use electricity. Ask your lactation consultant which type will work best for you. Breast pumps can be purchased, but some hospitals and breastfeeding support groups lease breast pumps on a monthly basis. A lactation consultant can teach you how to hand express breast milk, if you prefer not to use a pump. CARING FOR YOUR BREASTS WHILE YOU BREASTFEED Nipples can become dry, cracked, and sore while breastfeeding. The following recommendations can help keep your breasts moisturized and healthy:  Avoid using soap on your nipples.  Wear a supportive bra. Although not required, special nursing bras and tank tops are designed to allow access to your breasts for breastfeeding without taking off your entire bra or top. Avoid wearing underwire-style bras or extremely tight bras.  Air dry your nipples for 3-43mnutes after each feeding.  Use only cotton bra pads to absorb leaked breast milk. Leaking of breast milk between feedings  is normal.  Use lanolin on your nipples after breastfeeding. Lanolin helps to maintain your skin's normal moisture barrier. If you use pure lanolin, you do not need to wash it off before feeding your baby again. Pure lanolin is not toxic to your baby. You may also hand express a  few drops of breast milk and gently massage that milk into your nipples and allow the milk to air dry. In the first few weeks after giving birth, some women experience extremely full breasts (engorgement). Engorgement can make your breasts feel heavy, warm, and tender to the touch. Engorgement peaks within 3-5 days after you give birth. The following recommendations can help ease engorgement:  Completely empty your breasts while breastfeeding or pumping. You may want to start by applying warm, moist heat (in the shower or with warm water-soaked hand towels) just before feeding or pumping. This increases circulation and helps the milk flow. If your baby does not completely empty your breasts while breastfeeding, pump any extra milk after he or she is finished.  Wear a snug bra (nursing or regular) or tank top for 1-2 days to signal your body to slightly decrease milk production.  Apply ice packs to your breasts, unless this is too uncomfortable for you.  Make sure that your baby is latched on and positioned properly while breastfeeding. If engorgement persists after 48 hours of following these recommendations, contact your health care provider or a Science writer. OVERALL HEALTH CARE RECOMMENDATIONS WHILE BREASTFEEDING  Eat healthy foods. Alternate between meals and snacks, eating 3 of each per day. Because what you eat affects your breast milk, some of the foods may make your baby more irritable than usual. Avoid eating these foods if you are sure that they are negatively affecting your baby.  Drink milk, fruit juice, and water to satisfy your thirst (about 10 glasses a day).  Rest often, relax, and continue to take your prenatal vitamins to prevent fatigue, stress, and anemia.  Continue breast self-awareness checks.  Avoid chewing and smoking tobacco. Chemicals from cigarettes that pass into breast milk and exposure to secondhand smoke may harm your baby.  Avoid alcohol and drug use,  including marijuana. Some medicines that may be harmful to your baby can pass through breast milk. It is important to ask your health care provider before taking any medicine, including all over-the-counter and prescription medicine as well as vitamin and herbal supplements. It is possible to become pregnant while breastfeeding. If birth control is desired, ask your health care provider about options that will be safe for your baby. SEEK MEDICAL CARE IF:  You feel like you want to stop breastfeeding or have become frustrated with breastfeeding.  You have painful breasts or nipples.  Your nipples are cracked or bleeding.  Your breasts are red, tender, or warm.  You have a swollen area on either breast.  You have a fever or chills.  You have nausea or vomiting.  You have drainage other than breast milk from your nipples.  Your breasts do not become full before feedings by the fifth day after you give birth.  You feel sad and depressed.  Your baby is too sleepy to eat well.  Your baby is having trouble sleeping.   Your baby is wetting less than 3 diapers in a 24-hour period.  Your baby has less than 3 stools in a 24-hour period.  Your baby's skin or the white part of his or her eyes becomes yellow.   Your baby is  not gaining weight by 44 days of age. SEEK IMMEDIATE MEDICAL CARE IF:  Your baby is overly tired (lethargic) and does not want to wake up and feed.  Your baby develops an unexplained fever.   This information is not intended to replace advice given to you by your health care provider. Make sure you discuss any questions you have with your health care provider.   Document Released: 06/20/2005 Document Revised: 03/11/2015 Document Reviewed: 12/12/2012 Elsevier Interactive Patient Education 2016 [employer]. Postpartum Depression and Baby Blues The postpartum period begins right after the birth of a baby. During this time, there is often a great amount of joy and  excitement. It is also a time of many changes in the life of the parents. Regardless of how many times a mother gives birth, each child brings new challenges and dynamics to the family. It is not unusual to have feelings of excitement along with confusing shifts in moods, emotions, and thoughts. All mothers are at risk of developing postpartum depression or the "baby blues." These mood changes can occur right after giving birth, or they may occur many months after giving birth. The baby blues or postpartum depression can be mild or severe. Additionally, postpartum depression can go away rather quickly, or it can be a long-term condition.  CAUSES Raised hormone levels and the rapid drop in those levels are thought to be a main cause of postpartum depression and the baby blues. A number of hormones change during and after pregnancy. Estrogen and progesterone usually decrease right after the delivery of your baby. The levels of thyroid hormone and various cortisol steroids also rapidly drop. Other factors that play a role in these mood changes include major life events and genetics.  RISK FACTORS If you have any of the following risks for the baby blues or postpartum depression, know what symptoms to watch out for during the postpartum period. Risk factors that may increase the likelihood of getting the baby blues or postpartum depression include:  Having a personal or family history of depression.   Having depression while being pregnant.   Having premenstrual mood issues or mood issues related to oral contraceptives.  Having a lot of life stress.   Having marital conflict.   Lacking a social support network.   Having a baby with special needs.   Having health problems, such as diabetes.  SIGNS AND SYMPTOMS Symptoms of baby blues include:  Brief changes in mood, such as going from extreme happiness to sadness.  Decreased concentration.   Difficulty sleeping.   Crying spells,  tearfulness.   Irritability.   Anxiety.  Symptoms of postpartum depression typically begin within the first month after giving birth. These symptoms include:  Difficulty sleeping or excessive sleepiness.   Marked weight loss.   Agitation.   Feelings of worthlessness.   Lack of interest in activity or food.  Postpartum psychosis is a very serious condition and can be dangerous. Fortunately, it is rare. Displaying any of the following symptoms is cause for immediate medical attention. Symptoms of postpartum psychosis include:   Hallucinations and delusions.   Bizarre or disorganized behavior.   Confusion or disorientation.  DIAGNOSIS  A diagnosis is made by an evaluation of your symptoms. There are no medical or lab tests that lead to a diagnosis, but there are various questionnaires that a health care provider may use to identify those with the baby blues, postpartum depression, or psychosis. Often, a screening tool called the [place] Postnatal Depression Scale  is used to diagnose depression in the postpartum period.  TREATMENT The baby blues usually goes away on its own in 1-2 weeks. Social support is often all that is needed. You will be encouraged to get adequate sleep and rest. Occasionally, you may be given medicines to help you sleep.  Postpartum depression requires treatment because it can last several months or longer if it is not treated. Treatment may include individual or group therapy, medicine, or both to address any social, physiological, and psychological factors that may play a role in the depression. Regular exercise, a healthy diet, rest, and social support may also be strongly recommended.  Postpartum psychosis is more serious and needs treatment right away. Hospitalization is often needed. HOME CARE INSTRUCTIONS  Get as much rest as you can. Nap when the baby sleeps.   Exercise regularly. Some women find yoga and walking to be beneficial.   Eat a  balanced and nourishing diet.   Do little things that you enjoy. Have a cup of tea, take a bubble bath, read your favorite magazine, or listen to your favorite music.  Avoid alcohol.   Ask for help with household chores, cooking, grocery shopping, or running errands as needed. Do not try to do everything.   Talk to people close to you about how you are feeling. Get support from your partner, family members, friends, or other new moms.  Try to stay positive in how you think. Think about the things you are grateful for.   Do not spend a lot of time alone.   Only take over-the-counter or prescription medicine as directed by your health care provider.  Keep all your postpartum appointments.   Let your health care provider know if you have any concerns.  SEEK MEDICAL CARE IF: You are having a reaction to or problems with your medicine. SEEK IMMEDIATE MEDICAL CARE IF:  You have suicidal feelings.   You think you may harm the baby or someone else. MAKE SURE YOU:  Understand these instructions.  Will watch your condition.  Will get help right away if you are not doing well or get worse.   This information is not intended to replace advice given to you by your health care provider. Make sure you discuss any questions you have with your health care provider.   Document Released: 03/24/2004 Document Revised: 06/25/2013 Document Reviewed: 04/01/2013 Elsevier Interactive Patient Education 2016 Elsevier Inc. Postpartum Care After Vaginal Delivery After you deliver your newborn (postpartum period), the usual stay in the hospital is 24-72 hours. If there were problems with your labor or delivery, or if you have other medical problems, you might be in the hospital longer.  While you are in the hospital, you will receive help and instructions on how to care for yourself and your newborn during the postpartum period.  While you are in the hospital:  Be sure to tell your nurses if you  have pain or discomfort, as well as where you feel the pain and what makes the pain worse.  If you had an incision made near your vagina (episiotomy) or if you had some tearing during delivery, the nurses may put ice packs on your episiotomy or tear. The ice packs may help to reduce the pain and swelling.  If you are breastfeeding, you may feel uncomfortable contractions of your uterus for a couple of weeks. This is normal. The contractions help your uterus get back to normal size.  It is normal to have some bleeding after  delivery.  For the first 1-3 days after delivery, the flow is red and the amount may be similar to a period.  It is common for the flow to start and stop.  In the first few days, you may pass some small clots. Let your nurses know if you begin to pass large clots or your flow increases.  Do not  flush blood clots down the toilet before having the nurse look at them.  During the next 3-10 days after delivery, your flow should become more watery and pink or brown-tinged in color.  Ten to fourteen days after delivery, your flow should be a small amount of yellowish-white discharge.  The amount of your flow will decrease over the first few weeks after delivery. Your flow may stop in 6-8 weeks. Most women have had their flow stop by 12 weeks after delivery.  You should change your sanitary pads frequently.  Wash your hands thoroughly with soap and water for at least 20 seconds after changing pads, using the toilet, or before holding or feeding your newborn.  You should feel like you need to empty your bladder within the first 6-8 hours after delivery.  In case you become weak, lightheaded, or faint, call your nurse before you get out of bed for the first time and before you take a shower for the first time.  Within the first few days after delivery, your breasts may begin to feel tender and full. This is called engorgement. Breast tenderness usually goes away within 48-72  hours after engorgement occurs. You may also notice milk leaking from your breasts. If you are not breastfeeding, do not stimulate your breasts. Breast stimulation can make your breasts produce more milk.  Spending as much time as possible with your newborn is very important. During this time, you and your newborn can feel close and get to know each other. Having your newborn stay in your room (rooming in) will help to strengthen the bond with your newborn. It will give you time to get to know your newborn and become comfortable caring for your newborn.  Your hormones change after delivery. Sometimes the hormone changes can temporarily cause you to feel sad or tearful. These feelings should not last more than a few days. If these feelings last longer than that, you should talk to your caregiver.  If desired, talk to your caregiver about methods of family planning or contraception.  Talk to your caregiver about immunizations. Your caregiver may want you to have the following immunizations before leaving the hospital:  Tetanus, diphtheria, and pertussis (Tdap) or tetanus and diphtheria (Td) immunization. It is very important that you and your family (including grandparents) or others caring for your newborn are up-to-date with the Tdap or Td immunizations. The Tdap or Td immunization can help protect your newborn from getting ill.  Rubella immunization.  Varicella (chickenpox) immunization.  Influenza immunization. You should receive this annual immunization if you did not receive the immunization during your pregnancy.   This information is not intended to replace advice given to you by your health care provider. Make sure you discuss any questions you have with your health care provider.   Document Released: 04/17/2007 Document Revised: 03/14/2012 Document Reviewed: 02/15/2012 Elsevier Interactive Patient Education [employer]2016 Elsevier Inc.

## 2016-04-01 NOTE — Anesthesia Postprocedure Evaluation (Signed)
Anesthesia Post Note  Patient: Jillian Mcfarland  Procedure(s) Performed: * No procedures listed *  Patient location during evaluation: Mother Baby Anesthesia Type: Epidural Level of consciousness: awake Pain management: pain level controlled Vital Signs Assessment: post-procedure vital signs reviewed and stable Respiratory status: spontaneous breathing Cardiovascular status: stable Postop Assessment: no headache, no backache, epidural receding and patient able to bend at knees Anesthetic complications: no     Last Vitals:  Vitals:   04/01/16 0015 04/01/16 0415  BP: (!) 122/56 (!) 113/51  Pulse: 85 66  Resp: 16 16  Temp: 36.8 C 37 C    Last Pain:  Vitals:   04/01/16 0549  TempSrc:   PainSc: 0-No pain   Pain Goal:                 Edison PaceWILKERSON,Nyomi Howser

## 2016-04-02 MED ORDER — IBUPROFEN 600 MG PO TABS: 600.0000 mg | ORAL_TABLET | Freq: Four times a day (QID) | ORAL | 2 refills | Status: AC | PRN

## 2016-04-02 NOTE — Lactation Note (Signed)
This note was copied from a baby's chart. Lactation Consultation Note  Patient Name: Jillian Mcfarland ZOXWR'UToday's Date: 04/02/2016 Reason for consult: Follow-up assessment  With this mom of a term baby, now 1938 hours old. Mom has independently applied 20 nipple shisld, and breast fed . She is using shield on both breast now, and then supplementing with formula. Mom wants to go home today, but agreed to coming in for an o/p consult, on 10/4 /17 at 4 pm. I advised mom to maintain her milk supply with breast feeding, and pumping to comfort as needed. Mom also knows if baby not breast feeding, to pump at least every 3 hours, to protect her milk supply and to feed baby EBM as supplement. ZMom knows to call lactation as needed.    Maternal Data    Feeding    LATCH Score/Interventions                      Lactation Tools Discussed/Used     Consult Status Consult Status: Complete Date: 04/06/16 (4 pm) Follow-up type: Out-patient    Jillian Mcfarland, Jillian Mcfarland 04/02/2016, 12:03 PM

## 2016-04-02 NOTE — Progress Notes (Signed)
Discharge instructions and follow up care reviewed. All questions answered.  Baby discharged into care of parents.  IV removed prior to discharge.  All belongings at bedside and sent home with patient. In stable condition upon discharge. Escorted to main entrance in car seat. HUGS tag removed from baby prior to discharge.

## 2016-04-06 ENCOUNTER — Ambulatory Visit (HOSPITAL_COMMUNITY): Payer: Medicaid Other

## 2016-04-14 NOTE — H&P (Signed)
LABOR ADMISSION HISTORY AND PHYSICAL  Jillian Mcfarland is a 30 y.o. female G2P2001 with IUP at 8840w1d presenting with labor. She reports +FMs, No LOF, no VB, no blurry vision, headaches or peripheral edema, and RUQ pain.  She plans on breast feeding.    EFW 6lbs  Prenatal History/Complications:  Past Medical History: Past Medical History:  Diagnosis Date  . Pyelonephritis   . Pyelonephritis affecting pregnancy in second trimester, antepartum   . Vaginal Pap smear, abnormal     Past Surgical History: Past Surgical History:  Procedure Laterality Date  . ADENOIDECTOMY      Obstetrical History: OB History    Gravida Para Term Preterm AB Living   2 2 2     1    SAB TAB Ectopic Multiple Live Births         1 1      Social History: Social History   Social History  . Marital status: Married    Spouse name: N/A  . Number of children: N/A  . Years of education: N/A   Social History Main Topics  . Smoking status: Former Smoker    Packs/day: 1.00    Years: 10.00    Types: Cigarettes    Quit date: 01/06/2014  . Smokeless tobacco: Former NeurosurgeonUser  . Alcohol use No  . Drug use: No  . Sexual activity: Yes   Other Topics Concern  . None   Social History Narrative  . None    Family History: Family History  Problem Relation Age of Onset  . Cancer Maternal Grandfather   . Cancer Paternal Grandmother     lung  . Cancer Paternal Grandfather     lung and skin    Allergies: No Known Allergies  No prescriptions prior to admission.     Review of Systems   All systems reviewed and negative except as stated in HPI  Blood pressure 122/81, pulse 79, temperature 98.3 F (36.8 C), temperature source Oral, resp. rate 18, height 5\' 6"  (1.676 m), weight 215 lb (97.5 kg), SpO2 100 %, unknown if currently breastfeeding. General appearance: wnl Lungs: clear to auscultation bilaterally Heart: regular rate and rhythm Abdomen: soft, non-tender; bowel sounds normal Pelvic:  adequate Extremities: Homans sign is negative, no sign of DVT DTR's wnl Presentation: vtx Fetal monitoring  Category 1 uc- adequate Dilation: 10 Dilation Complete Date: 03/31/16 Dilation Complete Time: 1948 Effacement (%): 100 Cervical Position: Middle Station: +1 Presentation: Vertex Exam by:: LCarpenter,RN    Prenatal labs: ABO, Rh: --/--/O POS, O POS (09/28 1905) Antibody: NEG (09/28 1905) Rubella: !Error! RPR: Non Reactive (09/28 1905)  HBsAg: Negative (03/13 0000)  HIV: Non-reactive (03/13 0000)  GBS: Negative (09/05 0000)    Prenatal Transfer Tool  Maternal Diabetes: no Genetic Screening: wnl Maternal Ultrasounds/Referrals:wnl Fetal Ultrasounds or other Referrals:  None Maternal Substance Abuse:  No Significant Maternal Medications: none Significant Maternal Lab Results: none  No results found for this or any previous visit (from the past 24 hour(s)).  Patient Active Problem List   Diagnosis Date Noted  . Vaginal delivery 04/01/2016  . First degree perineal laceration during delivery 04/01/2016  . Pyelonephritis during pregnancy 01/27/2016  . Migraine syndrome 01/07/2016  . Rubella non-immune status, antepartum 01/07/2016    Assessment: Labor 2nd stage Pain: eppidural-comfortable GBS negative Breastfeeding Anticipate Vaginal Delivery     Rhea PinkLori A Ashlea Dusing, CNM  03/31/16 1948

## 2016-11-11 ENCOUNTER — Encounter (HOSPITAL_COMMUNITY): Payer: Self-pay

## 2017-07-05 IMAGING — US US RENAL
1 series · 15 of 25 positions shown · non-contrast
Comparison: None

CLINICAL DATA: RIGHT lower back pain for 4 days, hematuria, fever,
27 weeks pregnant

EXAM:
RENAL / URINARY TRACT ULTRASOUND COMPLETE

[Series 1: us renal · 15 of 43 slices shown]
[im 1/43]
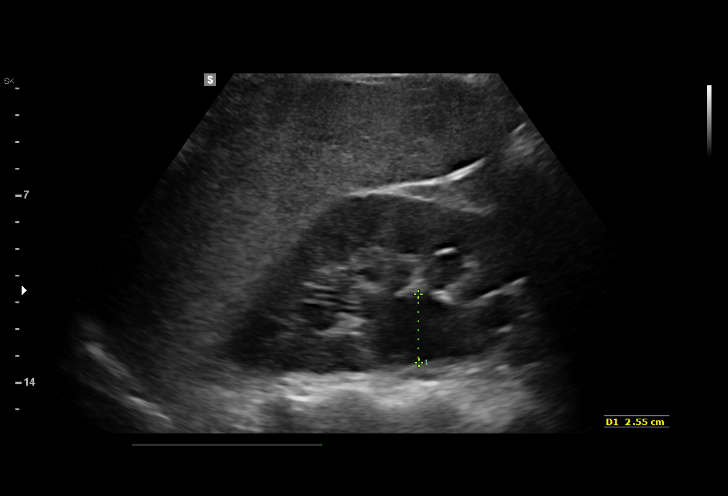
[im 4/43]
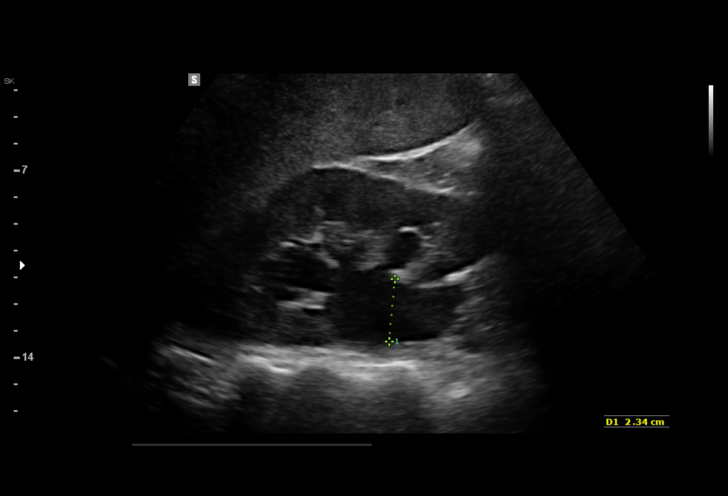
[im 8/43]
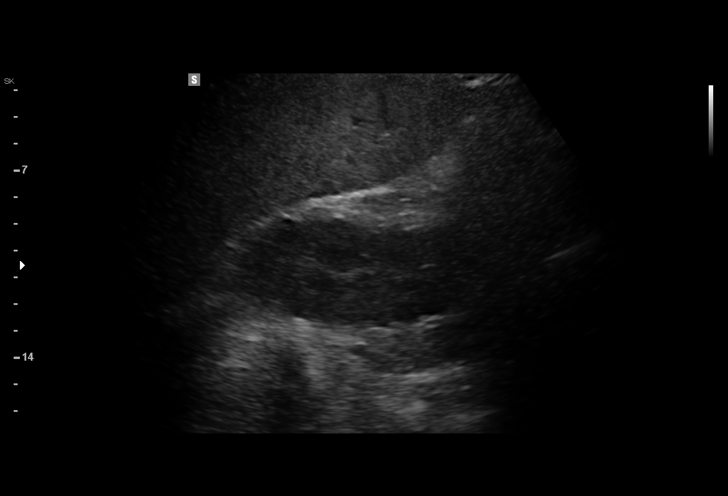
[im 9/43]
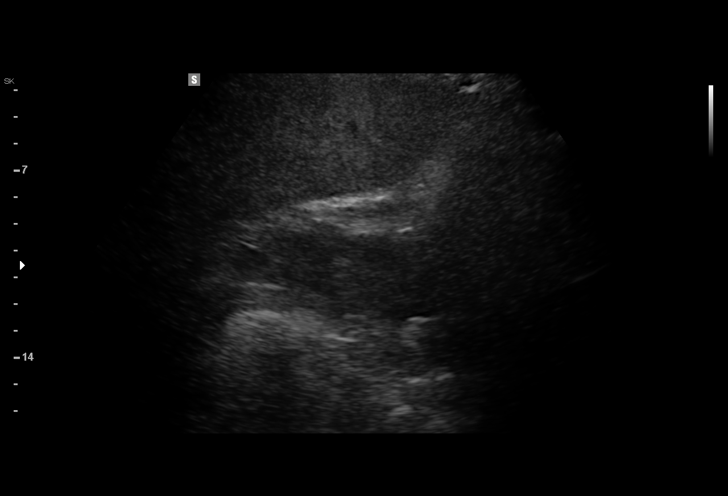
[im 13/43]
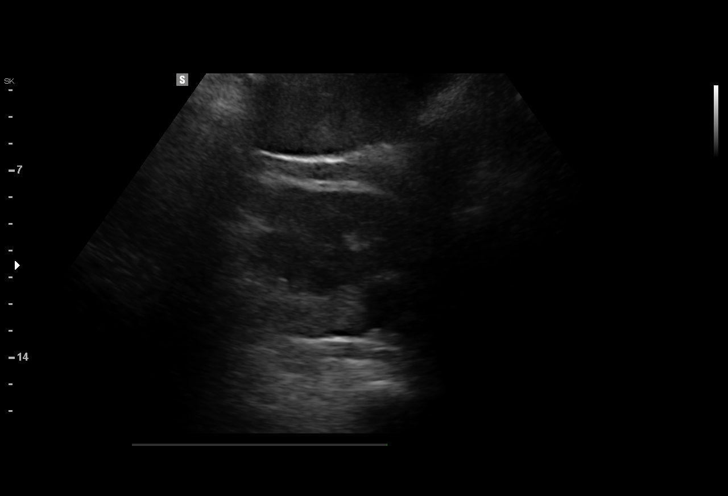
[im 16/43]
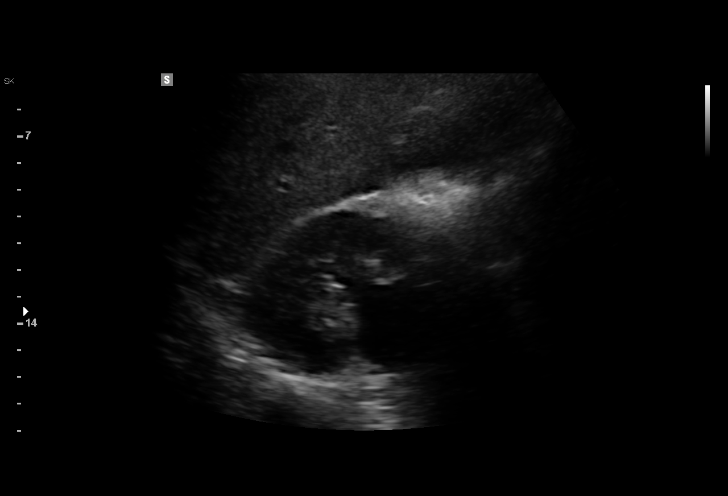
[im 18/43]
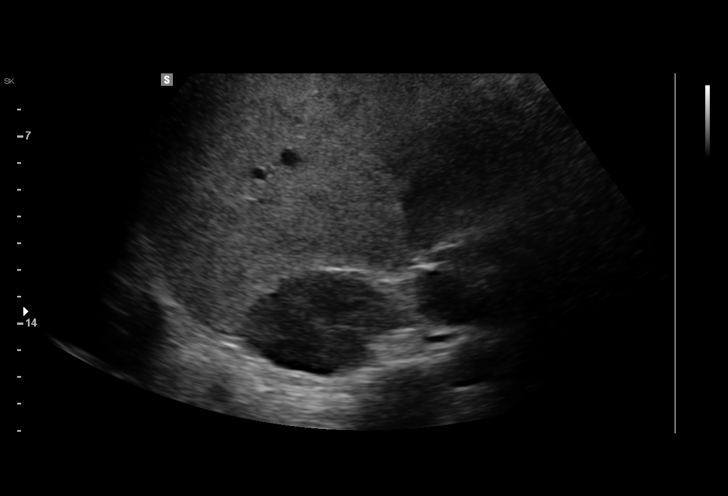
[im 22/43]
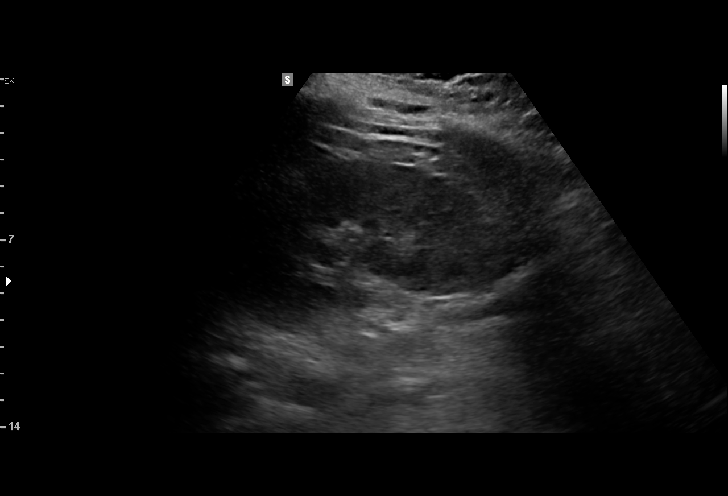
[im 25/43]
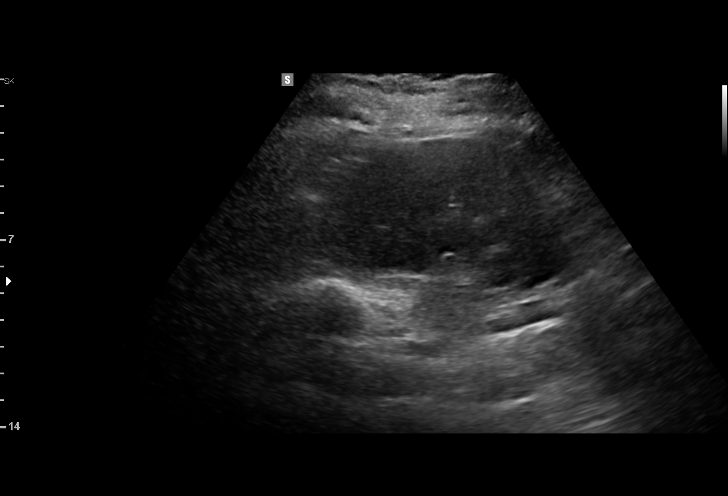
[im 27/43]
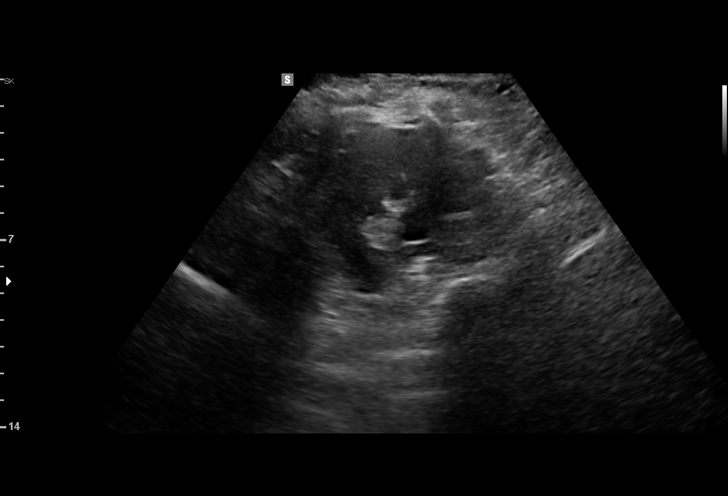
[im 30/43]
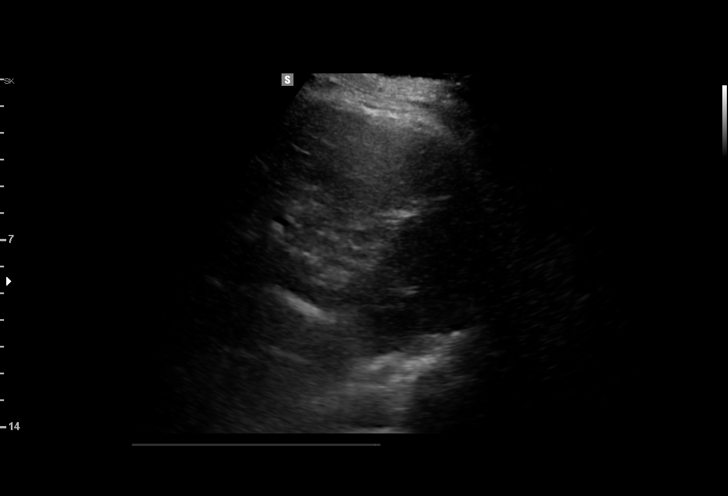
[im 34/43]
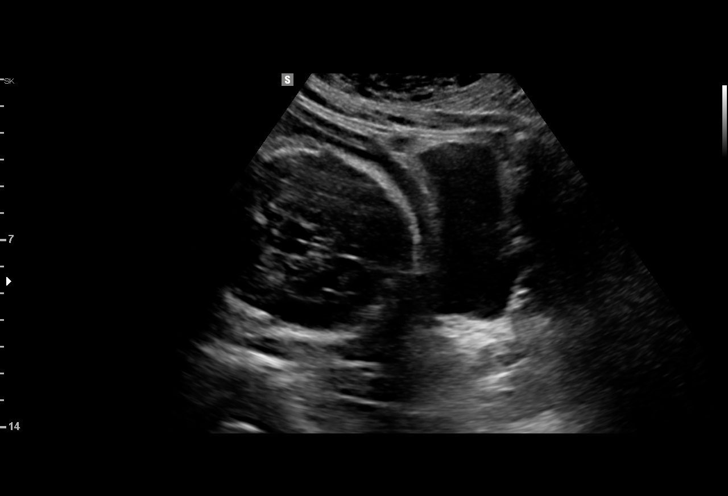
[im 36/43]
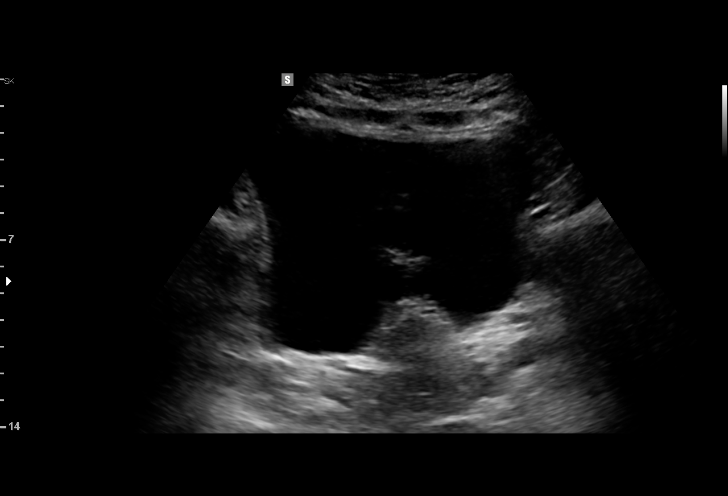
[im 39/43]
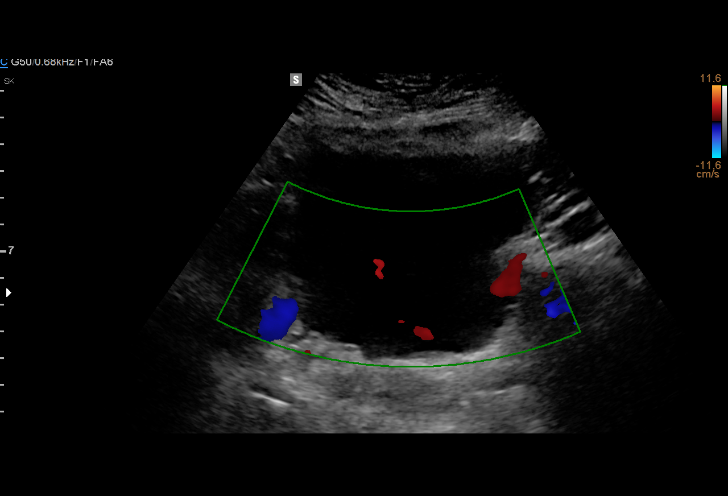
[im 43/43]
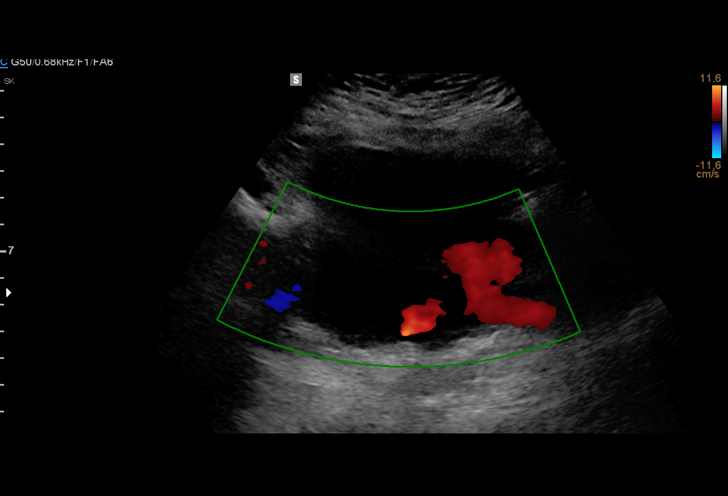

[15 of 25 positions shown; findings below may reference images not displayed]

FINDINGS: Right Kidney:

Length: 12.3 cm. Normal cortical thickness and echogenicity.
Moderate hydronephrosis. No renal mass or shadowing calcification.
No perinephric fluid. RIGHT renal pelvis 2.3 cm diameter.

Left Kidney:

Length: 10.6 cm. Less well visualized. No gross evidence of mass or
hydronephrosis.

Bladder:

Normal appearance. LEFT ureteral jet visualized. Minimal RIGHT
ureteral jet was noted.
IMPRESSION: Moderate RIGHT hydronephrosis with renal pelvis 2.3 cm diameter.

Diminished RIGHT ureteral jet versus LEFT.

Distal RIGHT ureteral obstruction not excluded.

## 2017-07-23 IMAGING — US US RENAL
1 series · 15 of 25 positions shown · non-contrast
Comparison: 01/07/2016

CLINICAL DATA: Patient is 29 weeks pregnant with history of UTI and
pyelonephritis.

EXAM:
RENAL / URINARY TRACT ULTRASOUND COMPLETE

[Series 1: us renal · 15 of 39 slices shown]
[im 1/39]
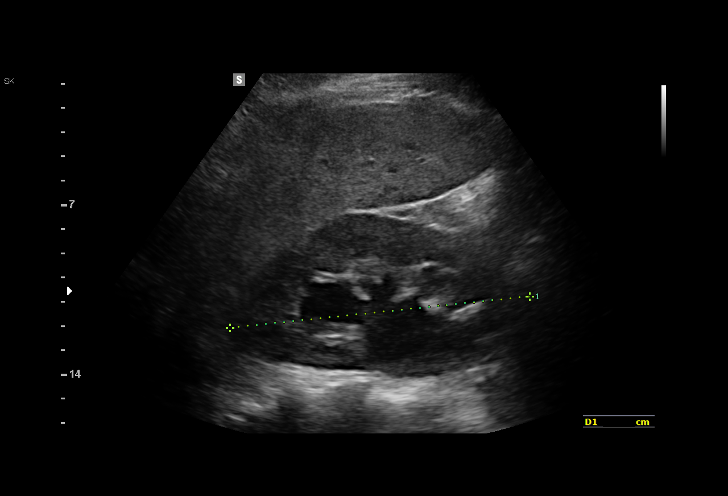
[im 4/39]
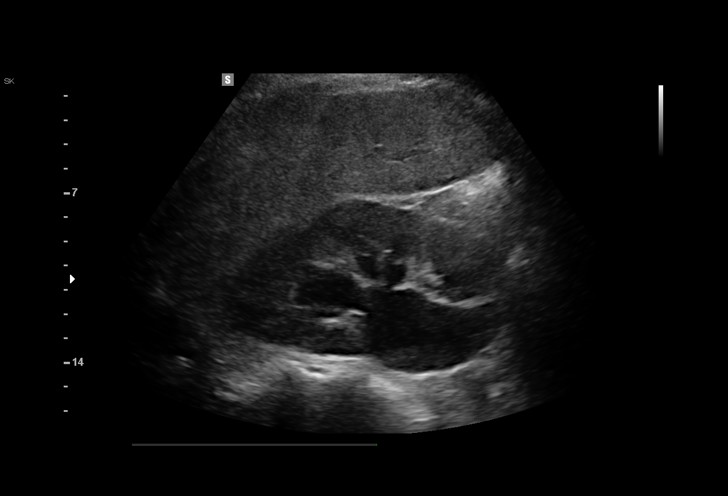
[im 7/39]
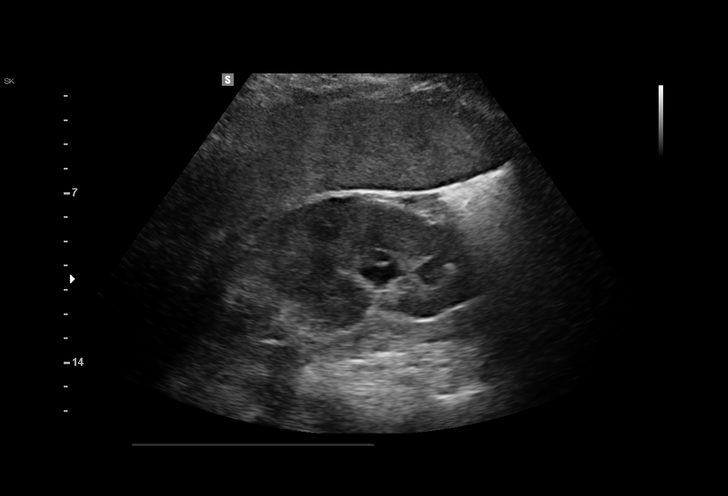
[im 8/39]
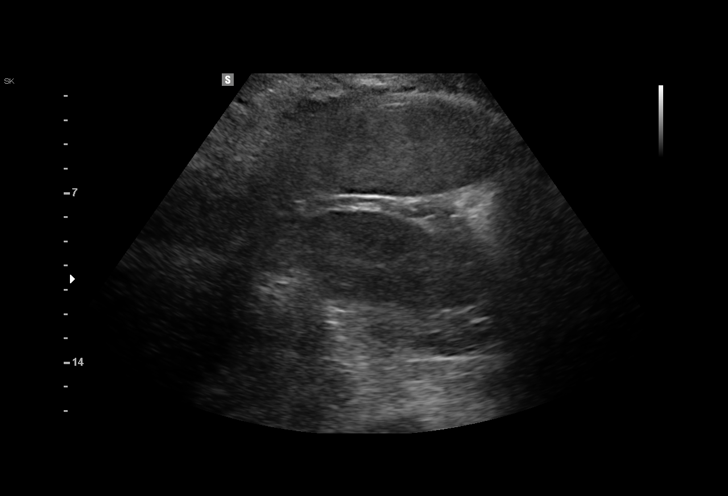
[im 12/39]
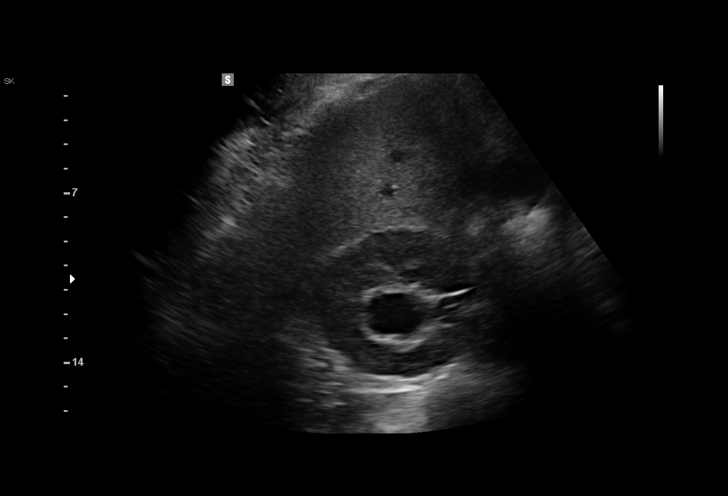
[im 15/39]
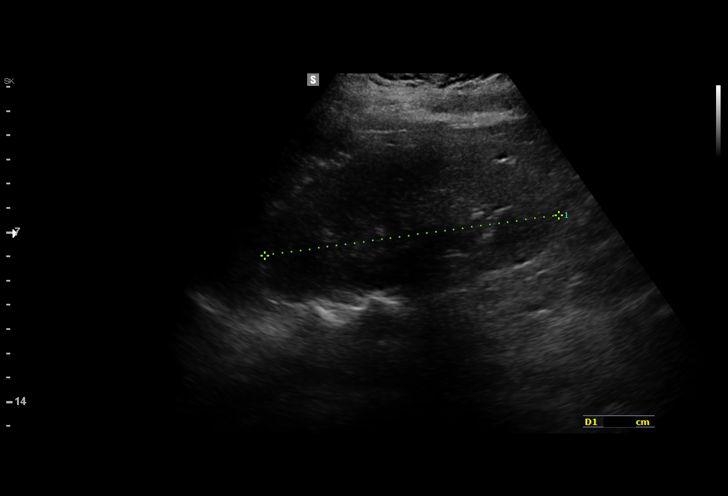
[im 16/39]
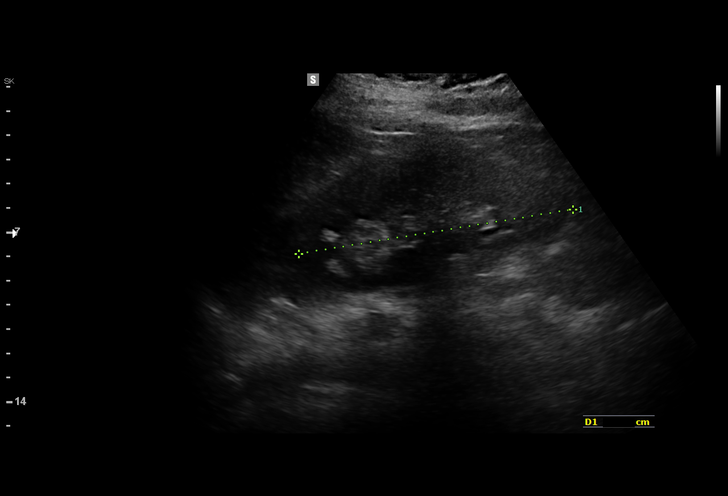
[im 20/39]
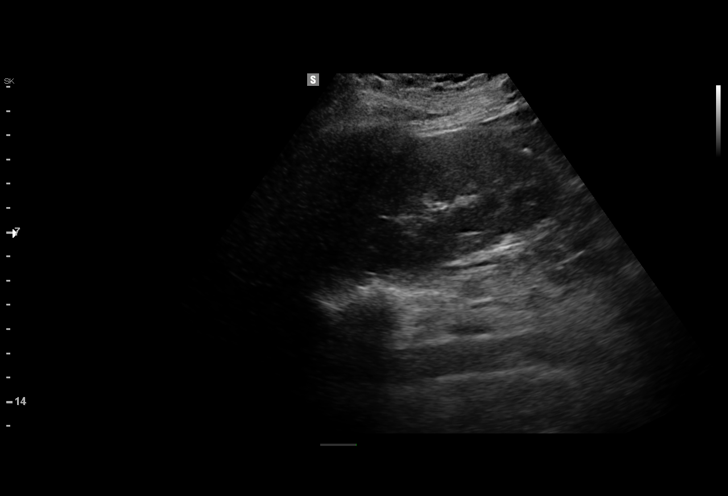
[im 23/39]
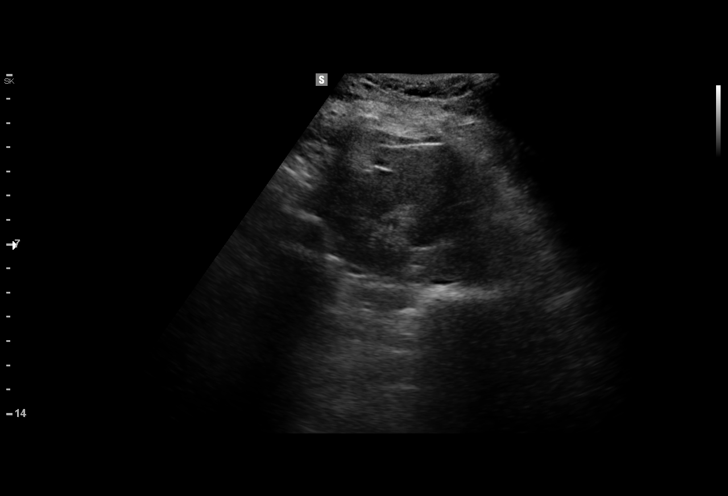
[im 24/39]
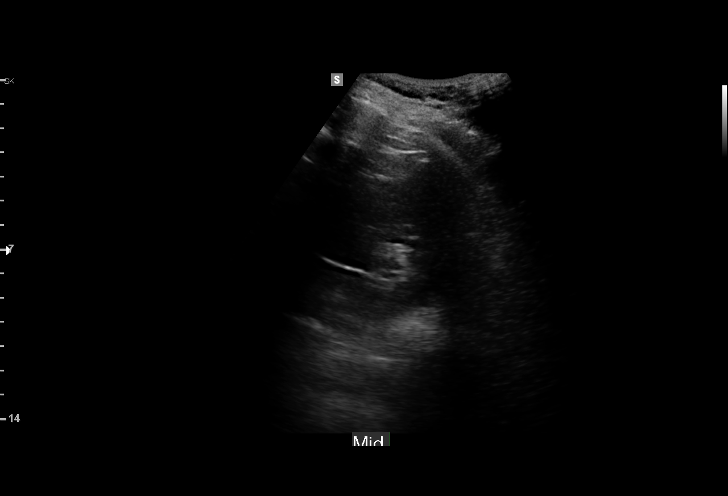
[im 27/39]
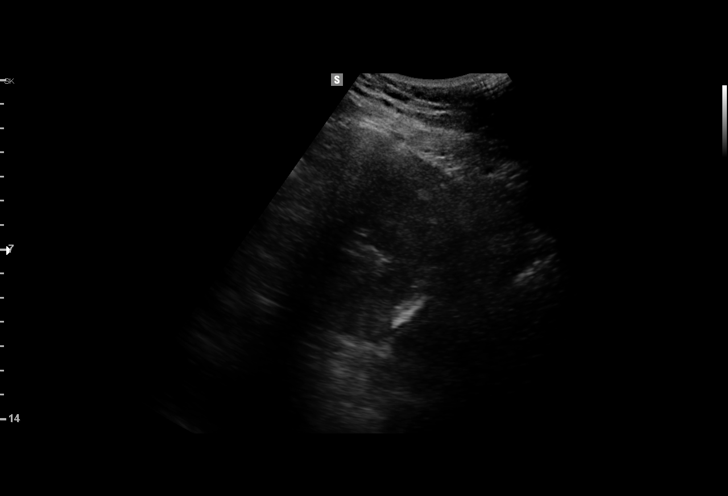
[im 31/39]
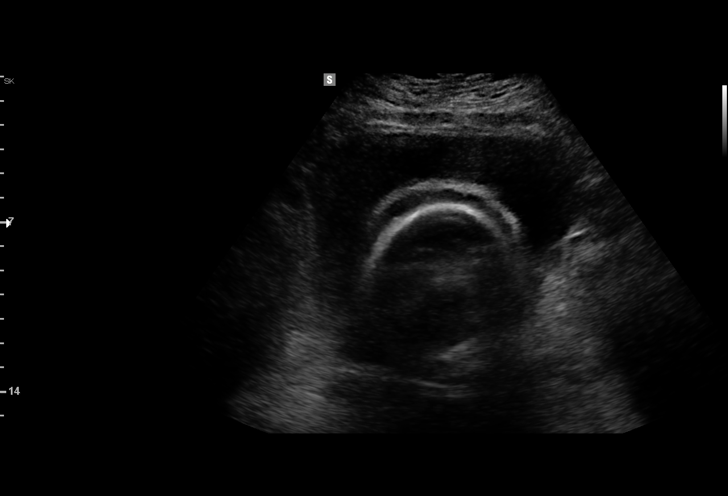
[im 32/39]
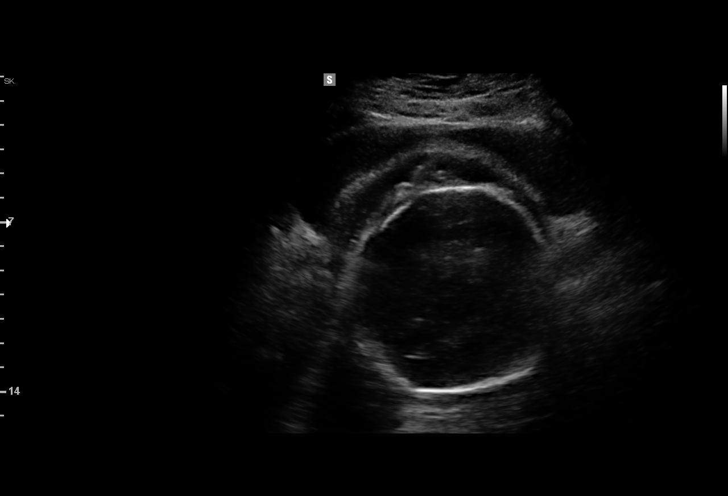
[im 35/39]
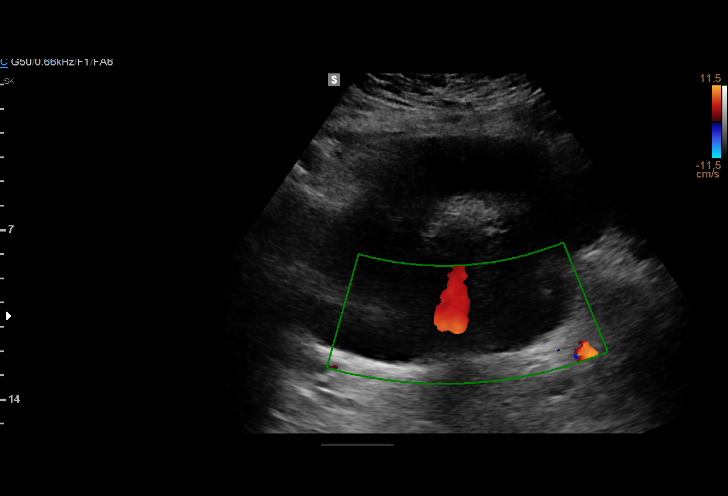
[im 39/39]
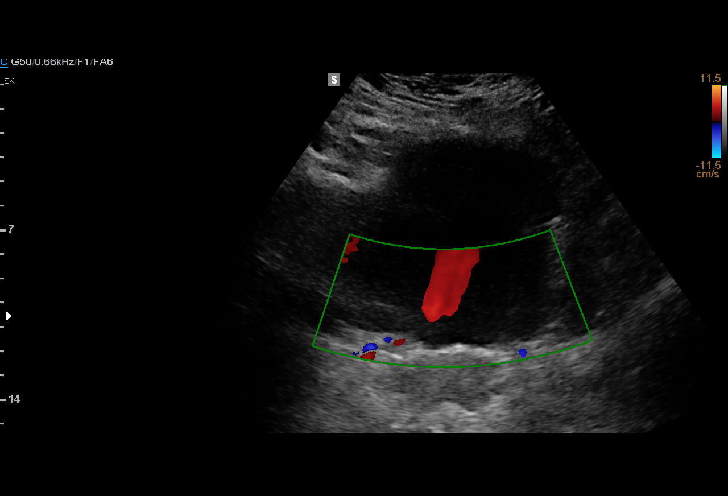

[15 of 25 positions shown; findings below may reference images not displayed]

FINDINGS: Right Kidney:

Length: 12.4 cm. Echogenicity within normal limits. No mass. There
is persistent moderate hydronephrosis with the renal pelvis
measuring 12 mm.

Left Kidney:

Length: 11.4. Echogenicity within normal limits. No mass or
hydronephrosis visualized.

Bladder:

Appears normal for degree of bladder distention. Bilateral ureteral
jets are seen.
IMPRESSION: Persistent right hydronephrosis, not unusual finding in the settings
of third trimester pregnancy.

Normal appearance of the left kidney.

Bilateral ureteral jets are seen within the urinary bladder.
